# Patient Record
Sex: Male | Born: 1965 | Race: White | Hispanic: Yes | Marital: Married | Smoking: Never smoker
Health system: Southern US, Community
[De-identification: ages and names within clinical notes are randomized; demographics above are authoritative.]

## PROBLEM LIST (undated history)

## (undated) DIAGNOSIS — E119 Type 2 diabetes mellitus without complications: Secondary | ICD-10-CM

## (undated) DIAGNOSIS — I10 Essential (primary) hypertension: Secondary | ICD-10-CM

---

## 2006-01-31 ENCOUNTER — Ambulatory Visit: Payer: Self-pay | Admitting: *Deleted

## 2006-01-31 ENCOUNTER — Ambulatory Visit (HOSPITAL_COMMUNITY): Admission: RE | Admit: 2006-01-31 | Discharge: 2006-01-31 | Payer: Self-pay | Admitting: Family Medicine

## 2006-01-31 ENCOUNTER — Ambulatory Visit: Payer: Self-pay | Admitting: Family Medicine

## 2006-11-20 ENCOUNTER — Ambulatory Visit: Payer: Self-pay | Admitting: Internal Medicine

## 2007-01-17 ENCOUNTER — Encounter (INDEPENDENT_AMBULATORY_CARE_PROVIDER_SITE_OTHER): Payer: Self-pay | Admitting: *Deleted

## 2007-08-22 ENCOUNTER — Emergency Department (HOSPITAL_COMMUNITY): Admission: EM | Admit: 2007-08-22 | Discharge: 2007-08-22 | Payer: Self-pay | Admitting: Emergency Medicine

## 2007-09-03 ENCOUNTER — Ambulatory Visit: Payer: Self-pay | Admitting: Family Medicine

## 2007-09-16 IMAGING — CR DG KNEE 1-2V BILAT
4 series · 4 of 4 positions shown · non-contrast
Comparison: none

CLINICAL DATA: Left knee pain.  
 LEFT KNEE ? 2 VIEW:

[view not recorded (1 of 4)]
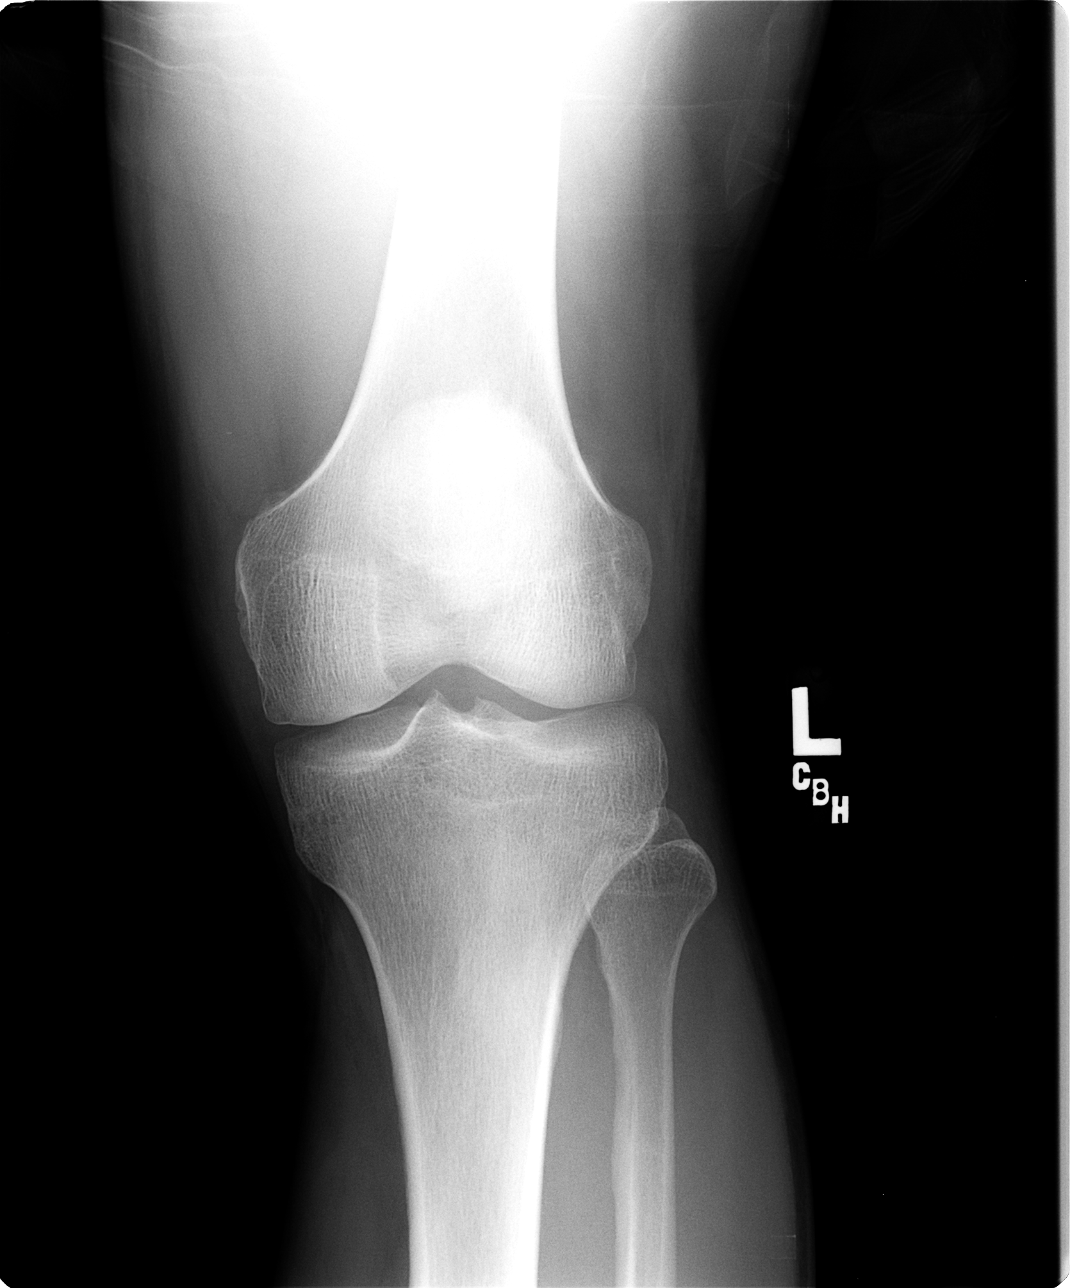

[view not recorded (2 of 4)]
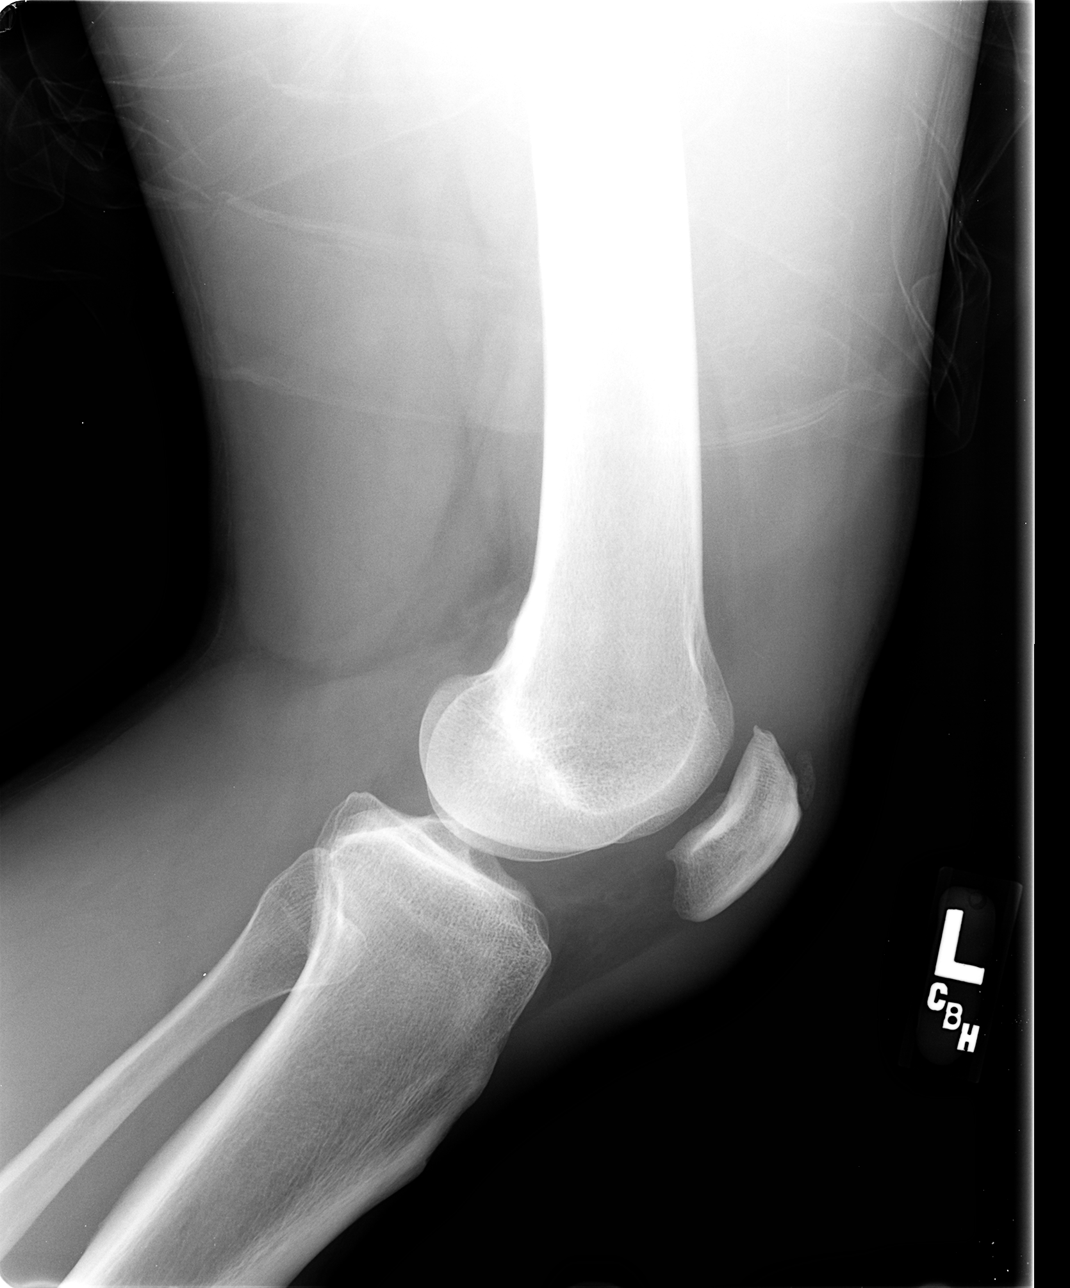

[view not recorded (3 of 4)]
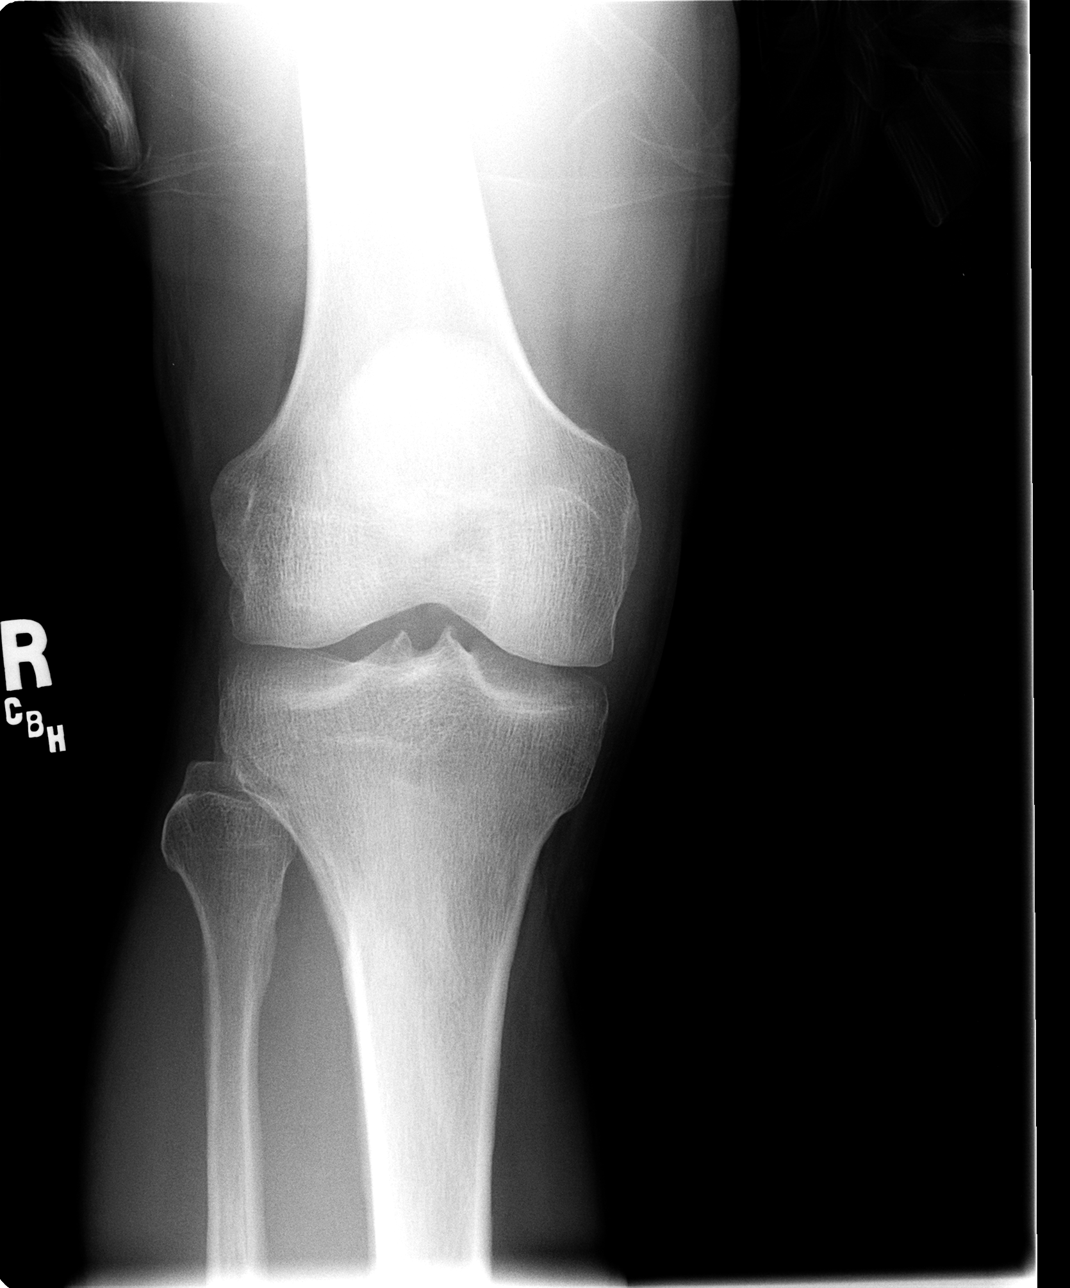

[view not recorded (4 of 4)]
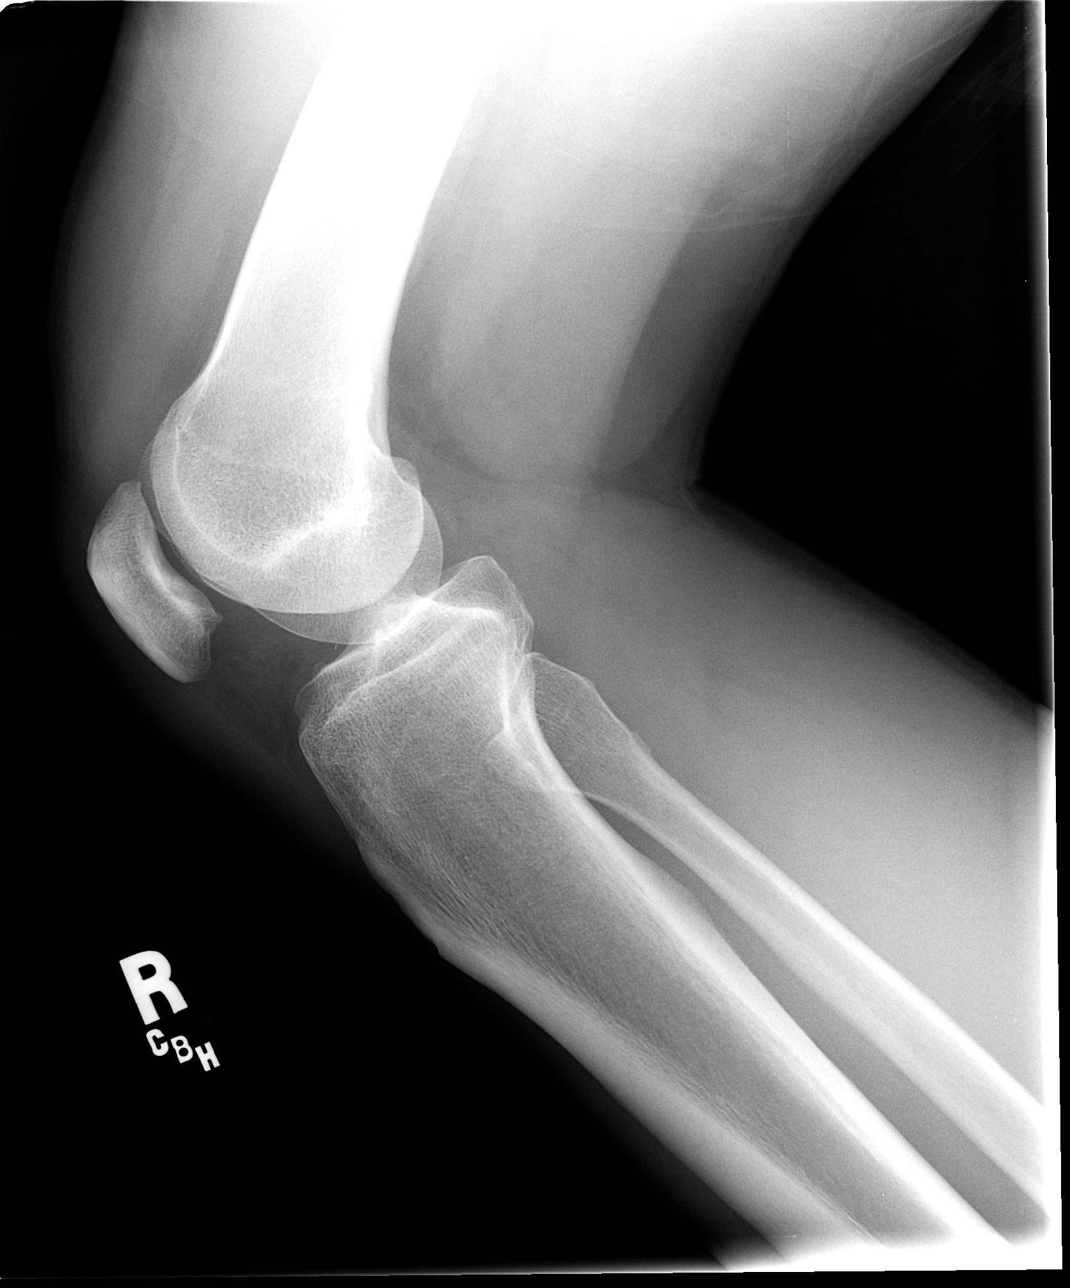

[4 of 4 positions shown; findings below may reference images not displayed]

FINDINGS: No fracture, dislocation, or significant degenerative changes.  Very mild spurring is noted involving the superior patella.  No joint fluid is seen.
IMPRESSION: Superior patellar spurring. 
 RIGHT KNEE ? 2 VIEW:
FINDINGS: Normal alignment.  No significant degenerative changes or fracture.  No joint effusion.
IMPRESSION: Normal right knee.

## 2009-03-24 ENCOUNTER — Ambulatory Visit: Payer: Self-pay | Admitting: Family Medicine

## 2009-03-24 LAB — CONVERTED CEMR LAB
Albumin: 4.8 g/dL (ref 3.5–5.2)
BUN: 12 mg/dL (ref 6–23)
Calcium: 9.6 mg/dL (ref 8.4–10.5)
Chloride: 101 meq/L (ref 96–112)
Eosinophils Relative: 2 % (ref 0–5)
Glucose, Bld: 128 mg/dL — ABNORMAL HIGH (ref 70–99)
HCT: 46.8 % (ref 39.0–52.0)
HDL: 44 mg/dL (ref >39)
Hemoglobin: 16.4 g/dL (ref 13.0–17.0)
Lymphocytes Relative: 27 % (ref 12–46)
Lymphs Abs: 1.5 10*3/uL (ref 0.7–4.0)
Monocytes Absolute: 0.5 10*3/uL (ref 0.1–1.0)
Monocytes Relative: 8 % (ref 3–12)
Potassium: 4.3 meq/L (ref 3.5–5.3)
Triglycerides: 379 mg/dL — ABNORMAL HIGH (ref <150)
WBC: 5.8 10*3/uL (ref 4.0–10.5)

## 2009-04-06 IMAGING — CR DG CHEST 2V
2 series · 2 of 2 positions shown · non-contrast
Comparison: None

CLINICAL DATA: Stomach pain

CHEST - 2 VIEW

[w chest pa]
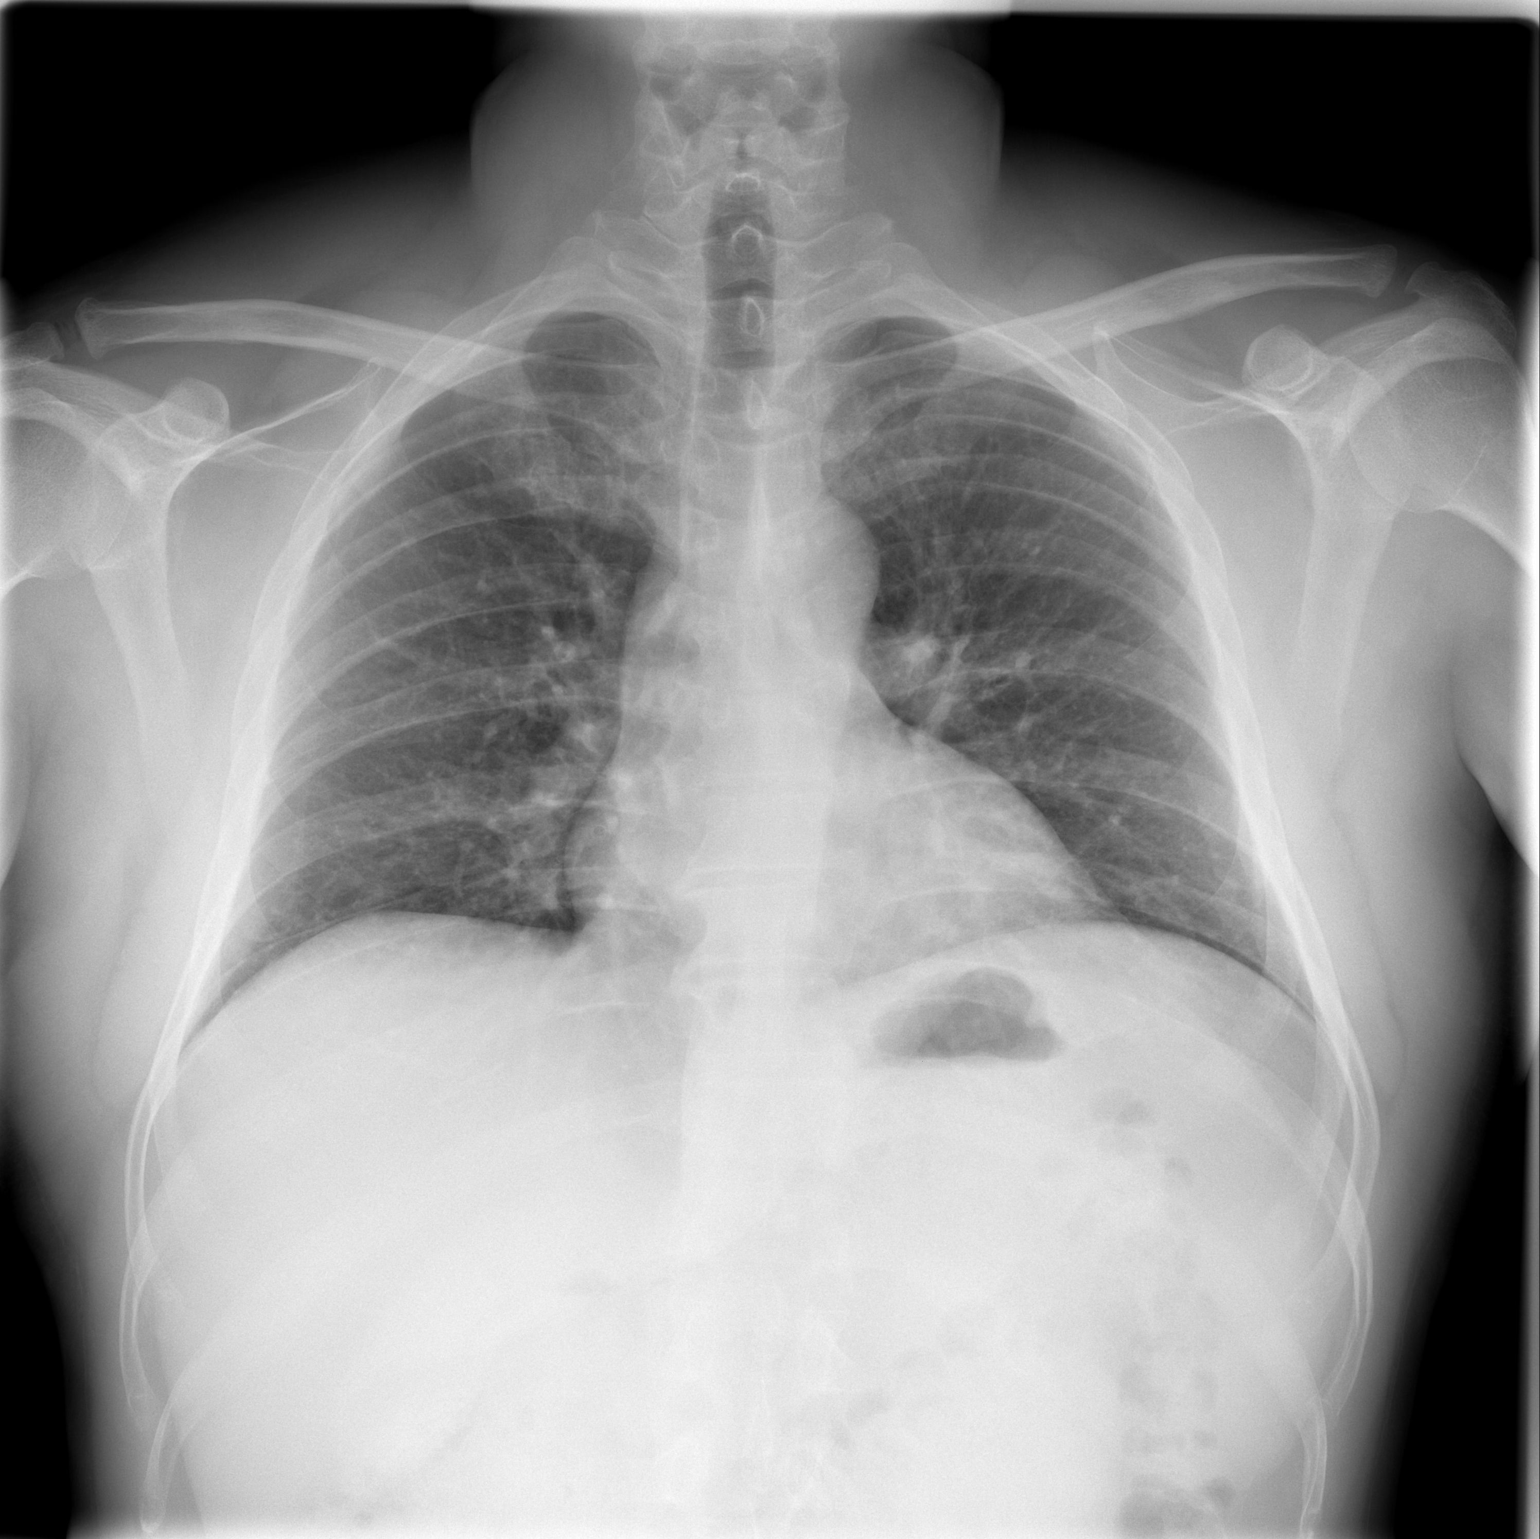

[w chest lat]
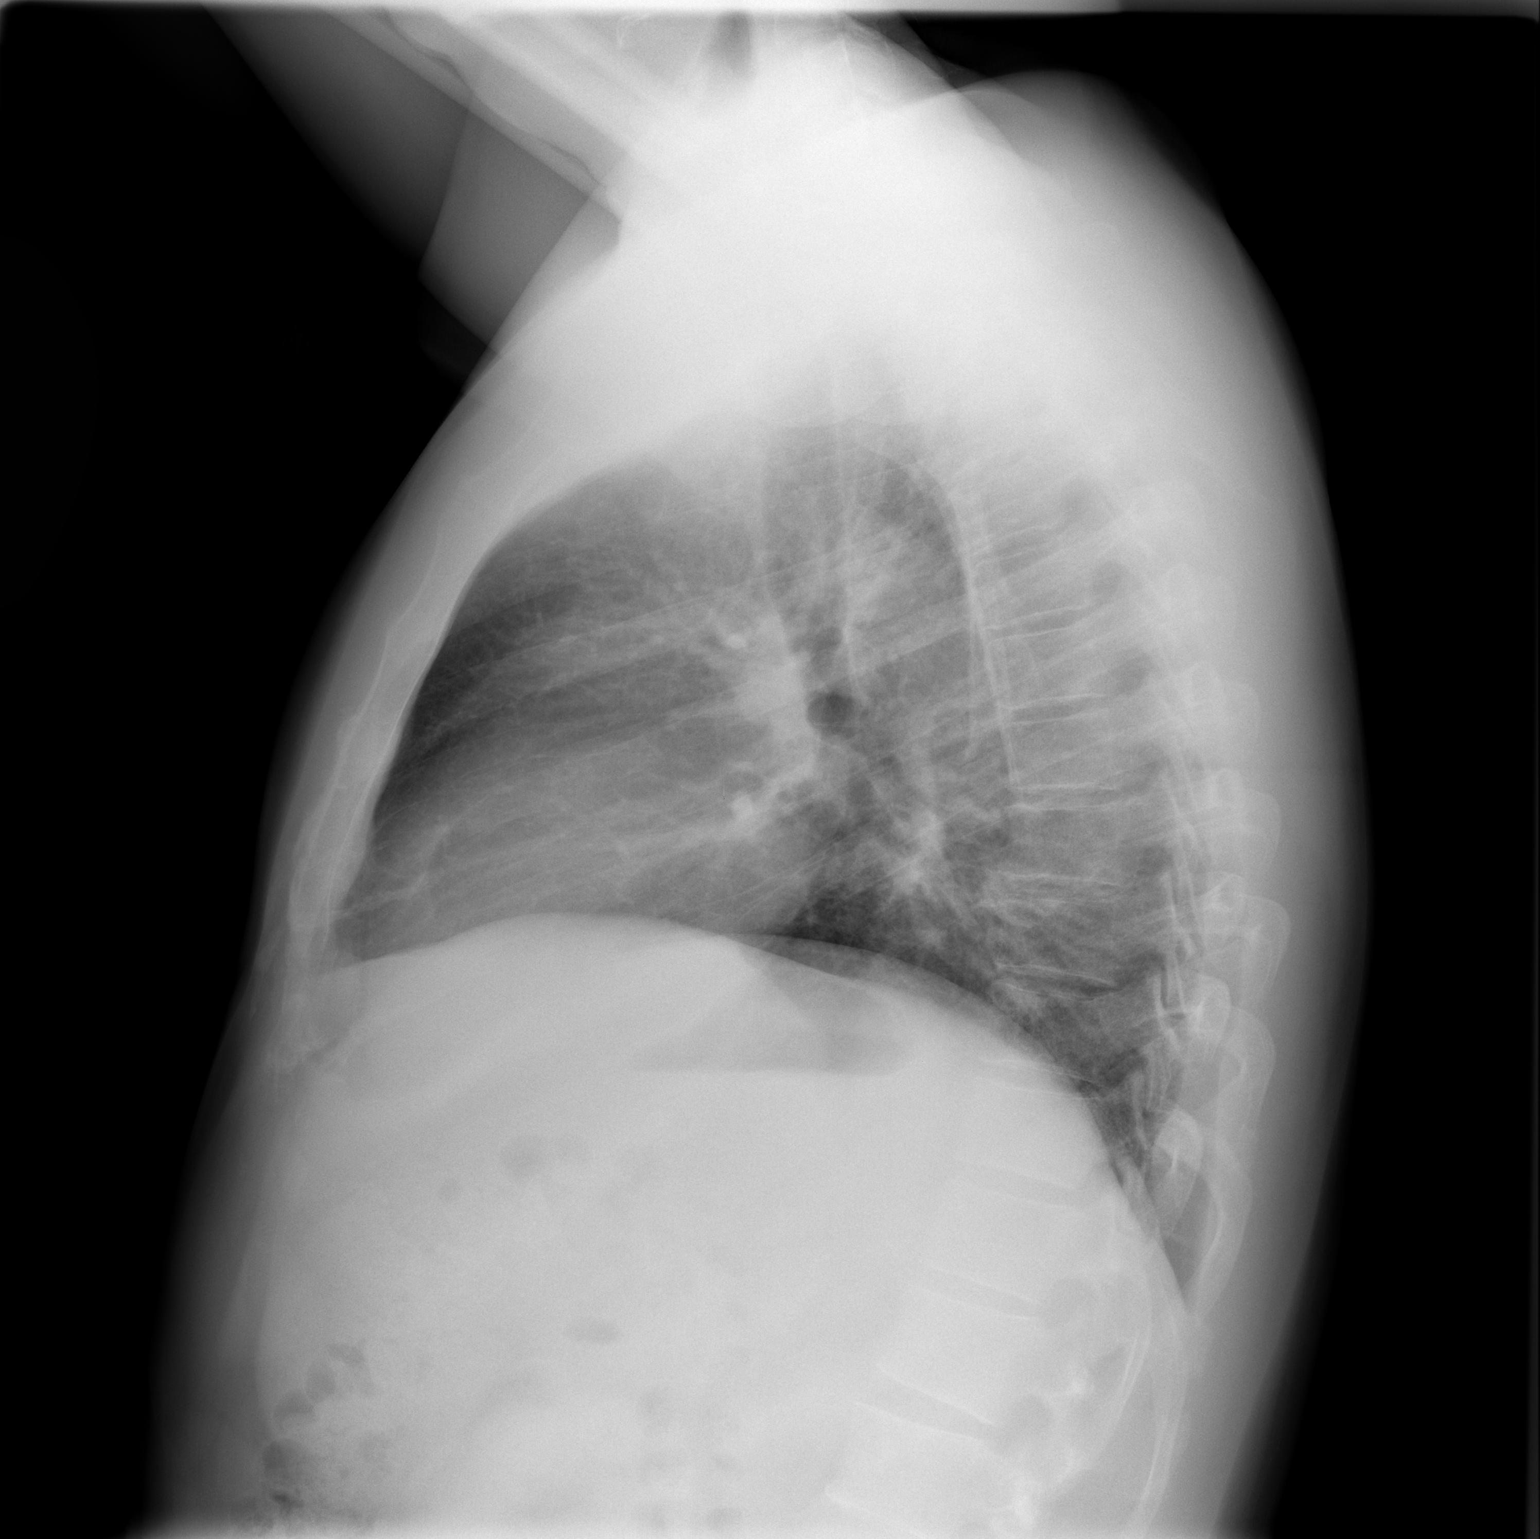

[2 of 2 positions shown; findings below may reference images not displayed]

FINDINGS: Heart size normal.

There is no pleural effusion or pulmonary edema.

The lung volumes are low.

No airspace opacities.
IMPRESSION: 1.  No active disease.

## 2009-04-30 ENCOUNTER — Ambulatory Visit: Payer: Self-pay | Admitting: Internal Medicine

## 2009-08-13 ENCOUNTER — Ambulatory Visit: Payer: Self-pay | Admitting: Family Medicine

## 2009-08-20 ENCOUNTER — Ambulatory Visit: Payer: Self-pay | Admitting: Family Medicine

## 2010-07-19 ENCOUNTER — Encounter (INDEPENDENT_AMBULATORY_CARE_PROVIDER_SITE_OTHER): Payer: Self-pay | Admitting: Family Medicine

## 2010-07-19 LAB — CONVERTED CEMR LAB
ALT: 19 units/L (ref 0–53)
AST: 19 units/L (ref 0–37)
CO2: 26 meq/L (ref 19–32)
Chloride: 102 meq/L (ref 96–112)
Cholesterol: 184 mg/dL (ref 0–200)
Sodium: 139 meq/L (ref 135–145)
Testosterone Free: 85.2 pg/mL (ref 47.0–244.0)
Testosterone-% Free: 2.3 % (ref 1.6–2.9)
Testosterone: 370.82 ng/dL (ref 250–890)
Total Bilirubin: 0.7 mg/dL (ref 0.3–1.2)
Total Protein: 7.5 g/dL (ref 6.0–8.3)
VLDL: 60 mg/dL — ABNORMAL HIGH (ref 0–40)

## 2018-10-22 ENCOUNTER — Ambulatory Visit
Admission: EM | Admit: 2018-10-22 | Discharge: 2018-10-22 | Disposition: A | Discharge status: Home / Self Care | Payer: Self-pay

## 2018-10-22 ENCOUNTER — Other Ambulatory Visit: Payer: Self-pay

## 2018-10-22 ENCOUNTER — Encounter: Payer: Self-pay | Admitting: Physician Assistant

## 2018-10-22 DIAGNOSIS — H5711 Ocular pain, right eye: Secondary | ICD-10-CM

## 2018-10-22 DIAGNOSIS — H109 Unspecified conjunctivitis: Secondary | ICD-10-CM

## 2018-10-22 HISTORY — DX: Essential (primary) hypertension: I10

## 2018-10-22 HISTORY — DX: Type 2 diabetes mellitus without complications: E11.9

## 2018-10-22 MED ORDER — OFLOXACIN 0.3 % OP SOLN: 1.0000 [drp] | Freq: Four times a day (QID) | OPHTHALMIC | 0 refills | Status: AC

## 2018-10-22 NOTE — Discharge Instructions (Signed)
Use ofloxacin eyedrops as directed on right eye. Artificial tear gel at night. Wait 10-15 minutes between drops, always use artificial tear gel last, as it prevents drops from penetrating through. Lid scrubs and warm compresses as directed. Monitor for any worsening of symptoms, changes in vision, sensitivity to light, eye swelling, painful eye movement, follow up with ophthalmology for further evaluation.

## 2018-10-22 NOTE — ED Provider Notes (Signed)
[facility]    CSN: [id number] Arrival date & time: 10/22/18  1654      History   Chief Complaint Chief Complaint  Patient presents with  . Eye Pain    HPI Steve Mccarthy is a 53 y.o. male.   53 year old male comes in for evaluation of possible left eye foreign body. States he was cutting wood yesterday and had had right eye pain, redness, watering and photophobia. Denies vision changes. He states was wearing regular glasses, but did not have other protective eye wear. He flushed his eye with water and still have foreign body sensation and came in for evaluation.      Past Medical History:  Diagnosis Date  . Diabetes mellitus without complication ([place])   . Hypertension     There are no active problems to display for this patient.   History reviewed. No pertinent surgical history.     Home Medications    Prior to Admission medications   Medication Sig Start Date End Date Taking? Authorizing Provider  DULoxetine (CYMBALTA) 20 MG capsule Take 20 mg by mouth daily.   Yes [provider]  insulin aspart (NOVOLOG) 100 UNIT/ML injection Inject 16 Units into the skin once.   Yes [provider]  lisinopril (ZESTRIL) 10 MG tablet Take 10 mg by mouth daily.   Yes [provider]  ofloxacin (OCUFLOX) 0.3 % ophthalmic solution Place 1 drop into the right eye 4 (four) times daily for 7 days. 10/22/18 10/29/18  Ok Edwards, PA-C    Family History No family history on file.  Social History Social History   Tobacco Use  . Smoking status: Never Smoker  . Smokeless tobacco: Never Used  Substance Use Topics  . Alcohol use: Not Currently  . Drug use: Not on file     Allergies   Patient has no known allergies.   Review of Systems Review of Systems  Reason unable to perform ROS: See HPI as above.     Physical Exam Triage Vital Signs ED Triage Vitals [10/22/18 1717]  Enc Vitals Group     BP 120/77     Pulse Rate  77     Resp 18     Temp 98.4 F (36.9 C)     Temp Source Oral     SpO2 97 %     Weight      Height      Head Circumference      Peak Flow      Pain Score 0     Pain Loc      Pain Edu?      Excl. in [place]?    No data found.  Updated Vital Signs BP 120/77 (BP Location: Left Arm)   Pulse 77   Temp 98.4 F (36.9 C) (Oral)   Resp 18   SpO2 97%   Visual Acuity Right Eye Distance: 20/30 Left Eye Distance: 20/40 Bilateral Distance: 20/20  Right Eye Near:   Left Eye Near:    Bilateral Near:     Physical Exam Constitutional:      General: He is not in acute distress.    Appearance: He is well-developed. He is not ill-appearing, toxic-appearing or diaphoretic.  HENT:     Head: Normocephalic and atraumatic.  Eyes:     General: Lids are normal. Lids are everted, no foreign bodies appreciated.     Extraocular Movements: Extraocular movements intact.     Conjunctiva/sclera:  Right eye: Right conjunctiva is injected.     Left eye: Left conjunctiva is not injected.     Pupils: Pupils are equal, round, and reactive to light.     Comments: No obvious foreign body seen. Fluorescein stain without uptake.  Neck:     Musculoskeletal: Normal range of motion and neck supple.  Skin:    General: Skin is warm and dry.  Neurological:     Mental Status: He is alert and oriented to person, place, and time.      UC Treatments / Results  Labs (all labs ordered are listed, but only abnormal results are displayed) Labs Reviewed - No data to display  EKG None  Radiology No results found.  Procedures Procedures (including critical care time)  Medications Ordered in UC Medications - No data to display  Initial Impression / Assessment and Plan / UC Course  I have reviewed the triage vital signs and the nursing notes.  Pertinent labs & imaging results that were available during my care of the patient were reviewed by me and considered in my medical decision making (see chart for  details).    No foreign body seen. Fluorescein stain without uptake. Patient with great improvement of symptoms after tetracaine, with resolution of pain and photophobia. Will cover for conjunctivitis with ofloxacin, but also ?irrigation from tap water flushing eye. Other symptomatic treatment discussed. Return precautions given. Patient expresses understanding and agrees to plan.  Final Clinical Impressions(s) / UC Diagnoses   Final diagnoses:  Acute right eye pain   ED Prescriptions    Medication Sig Dispense Auth. Provider   ofloxacin (OCUFLOX) 0.3 % ophthalmic solution Place 1 drop into the right eye 4 (four) times daily for 7 days. 1.4 mL Threasa AlphaYu, Falcon Mccaskey V, PA-C        Ison Wichmann V, [place] 10/22/18 1750

## 2018-10-22 NOTE — ED Triage Notes (Signed)
Pt c/o rt eye pain, redness, watery, and sensitive to light since after cutting wood yesterday.

## 2022-08-07 ENCOUNTER — Ambulatory Visit
Admission: EM | Admit: 2022-08-07 | Discharge: 2022-08-07 | Disposition: A | Discharge status: Home / Self Care | Payer: Self-pay | Attending: Internal Medicine | Admitting: Internal Medicine

## 2022-08-07 ENCOUNTER — Other Ambulatory Visit: Payer: Self-pay

## 2022-08-07 ENCOUNTER — Encounter: Payer: Self-pay | Admitting: Emergency Medicine

## 2022-08-07 DIAGNOSIS — S0501XA Injury of conjunctiva and corneal abrasion without foreign body, right eye, initial encounter: Secondary | ICD-10-CM

## 2022-08-07 MED ORDER — ERYTHROMYCIN 5 MG/GM OP OINT: TOPICAL_OINTMENT | OPHTHALMIC | 0 refills | Status: AC

## 2022-08-07 NOTE — Discharge Instructions (Addendum)
You have a corneal abrasion which is a scratch of your eye.  I have prescribed an antibiotic ointment to help soothe and treat/prevent infection related to this.  Follow-up with eye doctor tomorrow for further evaluation and management.

## 2022-08-07 NOTE — ED Triage Notes (Signed)
Pt here for right pain after something got into his eye last night

## 2022-08-07 NOTE — ED Provider Notes (Signed)
[facility]    CSN: [id number] Arrival date & time: 08/07/22  1144      History   Chief Complaint Chief Complaint  Patient presents with   Eye Pain    HPI Steve Mccarthy is a 57 y.o. male.   Patient presents with right eye pain and irritation that started last night.  Patient reports that he was playing outside with his granddaughter and thinks that something may have flew into his eye but he is not sure exactly what it was.  Denies blurry vision.  Reports that he washed his eye out.  Does not wear contacts or glasses.   Eye Pain    Past Medical History:  Diagnosis Date   Diabetes mellitus without complication    Hypertension     There are no problems to display for this patient.   History reviewed. No pertinent surgical history.     Home Medications    Prior to Admission medications   Medication Sig Start Date End Date Taking? Authorizing Provider  erythromycin ophthalmic ointment Place a 1/2 inch ribbon of ointment into the lower eyelid 4 times daily for 7 days. 08/07/22  Yes Gared Gillie, Rolly Salter E, FNP  DULoxetine (CYMBALTA) 20 MG capsule Take 20 mg by mouth daily.    [provider]  insulin aspart (NOVOLOG) 100 UNIT/ML injection Inject 16 Units into the skin once.    [provider]  lisinopril (ZESTRIL) 10 MG tablet Take 10 mg by mouth daily.    [provider]    Family History History reviewed. No pertinent family history.  Social History Social History   Tobacco Use   Smoking status: Never   Smokeless tobacco: Never  Substance Use Topics   Alcohol use: Not Currently     Allergies   Patient has no known allergies.   Review of Systems Review of Systems Per HPI  Physical Exam Triage Vital Signs ED Triage Vitals [08/07/22 1333]  Enc Vitals Group     BP (!) 138/90     Pulse Rate 90     Resp 18     Temp 98.1 F (36.7 C)     Temp Source Oral     SpO2 96 %     Weight      Height      Head  Circumference      Peak Flow      Pain Score 3     Pain Loc      Pain Edu?      Excl. in [place]?    No data found.  Updated Vital Signs BP (!) 138/90 (BP Location: Left Arm)   Pulse 90   Temp 98.1 F (36.7 C) (Oral)   Resp 18   SpO2 96%   Visual Acuity Right Eye Distance:   Left Eye Distance:   Bilateral Distance:    Right Eye Near: R Near: 20/20 Left Eye Near:  L Near: 20/20 Bilateral Near:  20/20  Physical Exam Constitutional:      General: He is not in acute distress.    Appearance: Normal appearance. He is not toxic-appearing or diaphoretic.  HENT:     Head: Normocephalic and atraumatic.  Eyes:     General: Lids are normal. Lids are everted, no foreign bodies appreciated. Vision grossly intact. Gaze aligned appropriately.     Extraocular Movements: Extraocular movements intact.     Conjunctiva/sclera: Conjunctivae normal.     Pupils: Pupils are equal, round, and reactive to light.  Right eye: Corneal abrasion and fluorescein uptake present.     Comments: Patient has approximately 2 millimeter corneal abrasion present to right upper eye with another 1 mm corneal abrasion directly to the left of that abrasion. No foreign bodies noted.   Pulmonary:     Effort: Pulmonary effort is normal.  Neurological:     General: No focal deficit present.     Mental Status: He is alert and oriented to person, place, and time. Mental status is at baseline.  Psychiatric:        Mood and Affect: Mood normal.        Behavior: Behavior normal.        Thought Content: Thought content normal.        Judgment: Judgment normal.      UC Treatments / Results  Labs (all labs ordered are listed, but only abnormal results are displayed) Labs Reviewed - No data to display  EKG   Radiology No results found.  Procedures Procedures (including critical care time)  Medications Ordered in UC Medications - No data to display  Initial Impression / Assessment and Plan / UC Course  I  have reviewed the triage vital signs and the nursing notes.  Pertinent labs & imaging results that were available during my care of the patient were reviewed by me and considered in my medical decision making (see chart for details).     Patient has 2 corneal abrasions present to right eye.  No obvious foreign bodies noted.  No obvious ulcer noted.  Visual acuity is normal so do not think that emergent evaluation is necessary.  Will treat with erythromycin.  Patient advised to follow-up with ophthalmology tomorrow given that there are 2 abrasions that are quite large.  He verbalized understanding and was agreeable with plan. Final Clinical Impressions(s) / UC Diagnoses   Final diagnoses:  Cornea abrasion, right, initial encounter     Discharge Instructions      You have a corneal abrasion which is a scratch of your eye.  I have prescribed an antibiotic ointment to help soothe and treat/prevent infection related to this.  Follow-up with eye doctor tomorrow for further evaluation and management.    ED Prescriptions     Medication Sig Dispense Auth. Provider   erythromycin ophthalmic ointment Place a 1/2 inch ribbon of ointment into the lower eyelid 4 times daily for 7 days. 3.5 g Gustavus Bryant, [place]      PDMP not reviewed this encounter.   Gustavus Bryant, [place] 08/07/22 1414

## 2023-05-30 ENCOUNTER — Encounter (HOSPITAL_COMMUNITY): Payer: Self-pay

## 2023-05-30 ENCOUNTER — Emergency Department (HOSPITAL_COMMUNITY)
Admission: EM | Admit: 2023-05-30 | Discharge: 2023-05-31 | Disposition: A | Discharge status: Home / Self Care | Payer: Self-pay | Attending: Emergency Medicine | Admitting: Emergency Medicine

## 2023-05-30 DIAGNOSIS — N200 Calculus of kidney: Secondary | ICD-10-CM | POA: Insufficient documentation

## 2023-05-30 DIAGNOSIS — R7309 Other abnormal glucose: Secondary | ICD-10-CM | POA: Insufficient documentation

## 2023-05-30 LAB — COMPREHENSIVE METABOLIC PANEL
ALT: 28 U/L (ref 0–44)
AST: 28 U/L (ref 15–41)
Albumin: 4 g/dL (ref 3.5–5.0)
Alkaline Phosphatase: 51 U/L (ref 38–126)
Anion gap: 9 (ref 5–15)
BUN: 10 mg/dL (ref 6–20)
CO2: 25 mmol/L (ref 22–32)
Calcium: 9.1 mg/dL (ref 8.9–10.3)
Chloride: 104 mmol/L (ref 98–111)
Creatinine, Ser: 1.29 mg/dL — ABNORMAL HIGH (ref 0.61–1.24)
GFR, Estimated: >60 mL/min (ref >60)
Glucose, Bld: 149 mg/dL — ABNORMAL HIGH (ref 70–99)
Potassium: 4.2 mmol/L (ref 3.5–5.1)
Sodium: 138 mmol/L (ref 135–145)
Total Bilirubin: 0.8 mg/dL (ref 0.0–1.2)
Total Protein: 7.5 g/dL (ref 6.5–8.1)

## 2023-05-30 LAB — CBC
HCT: 38 % — ABNORMAL LOW (ref 39.0–52.0)
Hemoglobin: 13 g/dL (ref 13.0–17.0)
MCH: 28.9 pg (ref 26.0–34.0)
MCHC: 34.2 g/dL (ref 30.0–36.0)
MCV: 84.4 fL (ref 80.0–100.0)
Platelets: 273 10*3/uL (ref 150–400)
RBC: 4.5 MIL/uL (ref 4.22–5.81)
RDW: 12.5 % (ref 11.5–15.5)
WBC: 8.1 10*3/uL (ref 4.0–10.5)
nRBC: 0 % (ref 0.0–0.2)

## 2023-05-30 LAB — LIPASE, BLOOD: Lipase: 43 U/L (ref 11–51)

## 2023-05-30 MED ORDER — OXYCODONE-ACETAMINOPHEN 5-325 MG PO TABS
2.0000 | ORAL_TABLET | Freq: Once | ORAL | Status: AC
  Administered 2023-05-30: 2 via ORAL
  Filled 2023-05-30: qty 2

## 2023-05-30 NOTE — ED Provider Triage Note (Signed)
Emergency Medicine Provider Triage Evaluation Note  Steve Mccarthy , a 58 y.o. male  was evaluated in triage.  Pt complains of LLQ abdominal pain that started yesterday.  States he feels a bit distended.  Reports difficulty with BMs.  No prior abdominal surgeries.  Review of Systems  Positive: Abdominal pain Negative: fever  Physical Exam  BP (!) 187/102   Pulse 75   Temp 98.9 F (37.2 C)   Resp 18   SpO2 99%  Gen:   Awake, no distress   Resp:  Normal effort  MSK:   Moves extremities without difficulty  Other:  LLQ TTP  Medical Decision Making  Medically screening exam initiated at 11:10 PM.  Appropriate orders placed.  Aymen Rodriguez-Gutierrez was informed that the remainder of the evaluation will be completed by another provider, this initial triage assessment does not replace that evaluation, and the importance of remaining in the ED until their evaluation is complete.     Roxy Horseman, PA-C 05/30/23 2311

## 2023-05-30 NOTE — ED Triage Notes (Signed)
Pt is coming in for abd pain that started yesterday morning, he mentions the pain is localized in his left flank going around into his back and abd. He also feels like his abd is slightly distended. He has been having regular daily bowel movents and has no urinary symptoms. No Hx of of diverticulitis, and no Hx of bowel obstruction

## 2023-05-31 ENCOUNTER — Emergency Department (HOSPITAL_COMMUNITY): Payer: Self-pay

## 2023-05-31 ENCOUNTER — Other Ambulatory Visit: Payer: Self-pay

## 2023-05-31 LAB — URINALYSIS, ROUTINE W REFLEX MICROSCOPIC
Bilirubin Urine: NEGATIVE
Glucose, UA: NEGATIVE mg/dL
Hgb urine dipstick: NEGATIVE
Ketones, ur: NEGATIVE mg/dL
Leukocytes,Ua: NEGATIVE
Nitrite: NEGATIVE
Protein, ur: NEGATIVE mg/dL
Specific Gravity, Urine: 1.032 — ABNORMAL HIGH (ref 1.005–1.030)
pH: 6 (ref 5.0–8.0)

## 2023-05-31 LAB — CBG MONITORING, ED: Glucose-Capillary: 149 mg/dL — ABNORMAL HIGH (ref 70–99)

## 2023-05-31 MED ORDER — ONDANSETRON 4 MG PO TBDP: 4.0000 mg | ORAL_TABLET | Freq: Three times a day (TID) | ORAL | 0 refills | Status: AC | PRN

## 2023-05-31 MED ORDER — IBUPROFEN 400 MG PO TABS: 400.0000 mg | ORAL_TABLET | Freq: Three times a day (TID) | ORAL | 0 refills | Status: AC

## 2023-05-31 MED ORDER — KETOROLAC TROMETHAMINE 30 MG/ML IJ SOLN
30.0000 mg | Freq: Once | INTRAMUSCULAR | Status: AC
  Administered 2023-05-31: 30 mg via INTRAVENOUS
  Filled 2023-05-31: qty 1

## 2023-05-31 MED ORDER — IOHEXOL 350 MG/ML SOLN
75.0000 mL | Freq: Once | INTRAVENOUS | Status: AC | PRN
  Administered 2023-05-31: 75 mL via INTRAVENOUS

## 2023-05-31 MED ORDER — HYDROCODONE-ACETAMINOPHEN 5-325 MG PO TABS: 1.0000 | ORAL_TABLET | Freq: Four times a day (QID) | ORAL | 0 refills | Status: AC | PRN

## 2023-05-31 NOTE — Discharge Instructions (Addendum)
Please be sure to follow-up with our urology colleagues.  Your kidney stone is 6 mm, and should pass on its own with good hydration, and taking your medications as prescribed.  However, if you develop new, or concerning changes, such as fever, persistent nausea and vomiting, return to the emergency department.

## 2023-05-31 NOTE — ED Notes (Signed)
This RN reviewed discharge instructions with patient. He verbalized understanding and denied any further questions. PT well appearing upon discharge and reports tolerable pain. Pt ambulated with stable gait to exit. Pt endorses ride home with family.

## 2023-05-31 NOTE — ED Provider Notes (Signed)
[place] EMERGENCY DEPARTMENT AT [facility] Provider Note   CSN: [id number] Arrival date & time: 05/30/23  2142     History  Chief Complaint  Patient presents with   Abdominal Pain    Steve Mccarthy is a 58 y.o. male.  HPI Patient presents with family member assist with the history.  He presents with acute onset flank pain yesterday.  Since that time pain has been persistent.  By the time my evaluation the patient's results are available and he is obtained Toradol, analgesia as well as Zofran for nausea. Patient is generally well has no history of renal issues, was well until today.    Home Medications Prior to Admission medications   Medication Sig Start Date End Date Taking? Authorizing Provider  HYDROcodone-acetaminophen (NORCO) 5-325 MG tablet Take 1 tablet by mouth every 6 (six) hours as needed for moderate pain (pain score 4-6). 05/31/23  Yes Gerhard Munch, MD  ibuprofen (ADVIL) 400 MG tablet Take 1 tablet (400 mg total) by mouth 3 (three) times daily for 3 days. Take one tablet three times daily for three days 05/31/23 06/03/23 Yes Gerhard Munch, MD  ondansetron (ZOFRAN-ODT) 4 MG disintegrating tablet Take 1 tablet (4 mg total) by mouth every 8 (eight) hours as needed for nausea or vomiting. 05/31/23  Yes Gerhard Munch, MD  DULoxetine (CYMBALTA) 20 MG capsule Take 20 mg by mouth daily.    [provider]  erythromycin ophthalmic ointment Place a 1/2 inch ribbon of ointment into the lower eyelid 4 times daily for 7 days. 08/07/22   Gustavus Bryant, FNP  insulin aspart (NOVOLOG) 100 UNIT/ML injection Inject 16 Units into the skin once.    [provider]  lisinopril (ZESTRIL) 10 MG tablet Take 10 mg by mouth daily.    [provider]      Allergies    Patient has no known allergies.    Review of Systems   Review of Systems  Physical Exam Updated Vital Signs BP (!) 157/85 (BP Location: Left Arm)   Pulse 70   Temp  98.2 F (36.8 C)   Resp 18   SpO2 100%  Physical Exam Vitals and nursing note reviewed.  Constitutional:      General: He is not in acute distress.    Appearance: He is well-developed.  HENT:     Head: Normocephalic and atraumatic.  Eyes:     Conjunctiva/sclera: Conjunctivae normal.  Cardiovascular:     Rate and Rhythm: Normal rate and regular rhythm.  Pulmonary:     Effort: Pulmonary effort is normal. No respiratory distress.     Breath sounds: No stridor.  Abdominal:     General: There is no distension.  Skin:    General: Skin is warm and dry.  Neurological:     Mental Status: He is alert and oriented to person, place, and time.     ED Results / Procedures / Treatments   Labs (all labs ordered are listed, but only abnormal results are displayed) Labs Reviewed  COMPREHENSIVE METABOLIC PANEL - Abnormal; Notable for the following components:      Result Value   Glucose, Bld 149 (*)    Creatinine, Ser 1.29 (*)    All other components within normal limits  CBC - Abnormal; Notable for the following components:   HCT 38.0 (*)    All other components within normal limits  URINALYSIS, ROUTINE W REFLEX MICROSCOPIC - Abnormal; Notable for the following components:   Specific Gravity,  Urine 1.032 (*)    All other components within normal limits  CBG MONITORING, ED - Abnormal; Notable for the following components:   Glucose-Capillary 149 (*)    All other components within normal limits  LIPASE, BLOOD    EKG None  Radiology CT ABDOMEN PELVIS W CONTRAST Result Date: 05/31/2023 CLINICAL DATA:  Left lower quadrant pain EXAM: CT ABDOMEN AND PELVIS WITH CONTRAST TECHNIQUE: Multidetector CT imaging of the abdomen and pelvis was performed using the standard protocol following bolus administration of intravenous contrast. RADIATION DOSE REDUCTION: This exam was performed according to the departmental dose-optimization program which includes automated exposure control, adjustment of  the mA and/or kV according to patient size and/or use of iterative reconstruction technique. CONTRAST:  75mL OMNIPAQUE IOHEXOL 350 MG/ML SOLN COMPARISON:  None Available. FINDINGS: Lower chest: Are minimal patchy ground-glass opacities in the lung bases. Hepatobiliary: No focal liver abnormality is seen. No gallstones, gallbladder wall thickening, or biliary dilatation. Pancreas: Unremarkable. No pancreatic ductal dilatation or surrounding inflammatory changes. Spleen: Normal in size without focal abnormality. Adrenals/Urinary Tract: There is a 6 x 3 x 2 mm calculus in the mid right ureter. There is mild right-sided hydronephrosis and perinephric fat stranding. Right kidney, bladder and adrenal glands are within normal limits. Stomach/Bowel: Stomach is within normal limits. Appendix appears normal. No evidence of bowel wall thickening, distention, or inflammatory changes. There some wall thickening of the distal esophagus. Vascular/Lymphatic: No significant vascular findings are present. No enlarged abdominal or pelvic lymph nodes. Reproductive: Uterus and bilateral adnexa are unremarkable. Other: There small fat containing inguinal hernias.  No ascites. Musculoskeletal: No acute or significant osseous findings. IMPRESSION: 1. 6 mm calculus in the mid left ureter with mild obstructive uropathy. 2. Minimal patchy ground-glass opacities in the lung bases, nonspecific. Findings may be infectious/inflammatory. 3. Wall thickening of the distal esophagus may represent esophagitis. Electronically Signed   By: Darliss Cheney M.D.   On: 05/31/2023 01:48    Procedures Procedures    Medications Ordered in ED Medications  oxyCODONE-acetaminophen (PERCOCET/ROXICET) 5-325 MG per tablet 2 tablet (2 tablets Oral Given 05/30/23 2319)  iohexol (OMNIPAQUE) 350 MG/ML injection 75 mL (75 mLs Intravenous Contrast Given 05/31/23 0138)  ketorolac (TORADOL) 30 MG/ML injection 30 mg (30 mg Intravenous Given 05/31/23 1610)    ED  Course/ Medical Decision Making/ A&P                                 Medical Decision Making Adult male presents with flank pain initial concern for renal dysfunction versus GI processes given his age, description of symptoms. Patient CT reviewed, notable for 6 mm stone, right intraureteral.  No evidence for complete obstruction, bacteremia, sepsis.  Patient had substantial pain control here, was discussed with family members, was discharged in stable condition.  Amount and/or Complexity of Data Reviewed Independent Historian:     Details: Family Labs:  Decision-making details documented in ED Course. Radiology: independent interpretation performed. Decision-making details documented in ED Course.  Risk Prescription drug management. Decision regarding hospitalization. Diagnosis or treatment significantly limited by social determinants of health.   Final Clinical Impression(s) / ED Diagnoses Final diagnoses:  Nephrolithiasis    Rx / DC Orders ED Discharge Orders          Ordered    ibuprofen (ADVIL) 400 MG tablet  3 times daily        05/31/23 0851    HYDROcodone-acetaminophen (NORCO)  5-325 MG tablet  Every 6 hours PRN        05/31/23 0851    ondansetron (ZOFRAN-ODT) 4 MG disintegrating tablet  Every 8 hours PRN        05/31/23 0851              Gerhard Munch, MD 05/31/23 [phone]

## 2023-12-18 ENCOUNTER — Ambulatory Visit: Payer: Self-pay | Attending: Family Medicine | Admitting: Rehabilitative and Restorative Service Providers"

## 2023-12-18 DIAGNOSIS — M6281 Muscle weakness (generalized): Secondary | ICD-10-CM | POA: Insufficient documentation

## 2023-12-18 DIAGNOSIS — R293 Abnormal posture: Secondary | ICD-10-CM | POA: Insufficient documentation

## 2023-12-18 DIAGNOSIS — M25512 Pain in left shoulder: Secondary | ICD-10-CM | POA: Insufficient documentation

## 2023-12-18 DIAGNOSIS — G8929 Other chronic pain: Secondary | ICD-10-CM | POA: Insufficient documentation

## 2023-12-18 DIAGNOSIS — M25511 Pain in right shoulder: Secondary | ICD-10-CM | POA: Insufficient documentation

## 2023-12-18 DIAGNOSIS — M25522 Pain in left elbow: Secondary | ICD-10-CM | POA: Insufficient documentation

## 2023-12-26 NOTE — Therapy (Incomplete)
 OUTPATIENT PHYSICAL THERAPY LOWER EXTREMITY EVALUATION   Patient Name: Steve Mccarthy MRN: [id number] DOB:02/05/1966, 58 y.o., male Today's Date: 12/26/2023  END OF SESSION:   Past Medical History:  Diagnosis Date   Diabetes mellitus without complication (HCC)    Hypertension    No past surgical history on file. There are no active problems to display for this patient.   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  Rationale for Evaluation and Treatment: Rehabilitation  ONSET DATE: ***  SUBJECTIVE:   SUBJECTIVE STATEMENT: ***  PERTINENT HISTORY: HTN, DBM PAIN:  Are you having pain? Yes: NPRS scale: *** Pain location: *** Pain description: *** Aggravating factors: *** Relieving factors: ***  PRECAUTIONS: {Therapy precautions:24002}  RED FLAGS: {PT Red Flags:29287}   WEIGHT BEARING RESTRICTIONS: {Yes ***/No:24003}  FALLS:  Has patient fallen in last 6 months? {fallsyesno:27318}  LIVING ENVIRONMENT: Lives with: {OPRC lives with:25569::lives with their family} Lives in: {Lives in:25570} Stairs: {opstairs:27293} Has following equipment at home: {Assistive devices:23999}  OCCUPATION: ***  PLOF: {PLOF:24004}  PATIENT GOALS: ***  NEXT MD VISIT: ***  OBJECTIVE:  Note: Objective measures were completed at Evaluation unless otherwise noted.  DIAGNOSTIC FINDINGS: ***  PATIENT SURVEYS:  LEFS  COGNITION: Overall cognitive status: {cognition:24006}     SENSATION: {sensation:27233}  EDEMA:  {edema:24020}  MUSCLE LENGTH: Hamstrings: Right *** deg; Left *** deg Debby test: Right *** deg; Left *** deg  POSTURE: {posture:25561}  PALPATION: ***  LOWER EXTREMITY ROM:  {AROM/PROM:27142} ROM Right eval Left eval  Hip flexion    Hip extension    Hip abduction    Hip adduction    Hip internal rotation     Hip external rotation    Knee flexion    Knee extension    Ankle dorsiflexion    Ankle plantarflexion    Ankle inversion    Ankle eversion     (Blank rows = not tested)  LOWER EXTREMITY MMT:  MMT Right eval Left eval  Hip flexion    Hip extension    Hip abduction    Hip adduction    Hip internal rotation    Hip external rotation    Knee flexion    Knee extension    Ankle dorsiflexion    Ankle plantarflexion    Ankle inversion    Ankle eversion     (Blank rows = not tested)  LOWER EXTREMITY SPECIAL TESTS:  {LEspecialtests:26242}  FUNCTIONAL TESTS:  {Functional tests:24029}  GAIT: Distance walked: *** Assistive device utilized: {Assistive devices:23999} Level of assistance: {Levels of assistance:24026} Comments: ***                                                                                                                                TREATMENT DATE:  Initial Evaluation & HEP created    PATIENT EDUCATION:  Education details: *** Person educated: {  Person educated:25204} Education method: {Education Method:25205} Education comprehension: {Education Comprehension:25206}  HOME EXERCISE PROGRAM: ***  ASSESSMENT:  CLINICAL IMPRESSION: Patient is a *** y.o. *** who was seen today for physical therapy evaluation and treatment for ***.   OBJECTIVE IMPAIRMENTS: {opptimpairments:25111}.   ACTIVITY LIMITATIONS: {activitylimitations:27494}  PARTICIPATION LIMITATIONS: {participationrestrictions:25113}  PERSONAL FACTORS: {Personal factors:25162} are also affecting patient's functional outcome.   REHAB POTENTIAL: {rehabpotential:25112}  CLINICAL DECISION MAKING: {clinical decision making:25114}  EVALUATION COMPLEXITY: {Evaluation complexity:25115}   GOALS: Goals reviewed with patient? {yes/no:20286}  SHORT TERM GOALS: Target date: *** *** Baseline: Goal status: INITIAL  2.  *** Baseline:  Goal status: INITIAL  3.  *** Baseline:  Goal  status: INITIAL  4.  *** Baseline:  Goal status: INITIAL  5.  *** Baseline:  Goal status: INITIAL  6.  *** Baseline:  Goal status: INITIAL  LONG TERM GOALS: Target date: ***  *** Baseline:  Goal status: INITIAL  2.  *** Baseline:  Goal status: INITIAL  3.  *** Baseline:  Goal status: INITIAL  4.  *** Baseline:  Goal status: INITIAL  5.  *** Baseline:  Goal status: INITIAL  6.  *** Baseline:  Goal status: INITIAL   PLAN:  PT FREQUENCY: {rehab frequency:25116}  PT DURATION: {rehab duration:25117}  PLANNED INTERVENTIONS: {rehab planned interventions:25118::97110-Therapeutic exercises,97530- Therapeutic [id number]- Neuromuscular re-education,97535- Self Rjmz,02859- Manual therapy}  PLAN FOR NEXT SESSION: PIERRETTE Kristeen Sar, PT 12/26/2023, 2:49 PM

## 2023-12-27 NOTE — Therapy (Signed)
 OUTPATIENT PHYSICAL THERAPY UPPER EXTREMITY EVALUATION   Patient Name: Steve Mccarthy MRN: [id number] DOB:[birth year], 58 y.o., male Today's Date: 12/28/2023  END OF SESSION:  PT End of Session - 12/28/23 0927     Visit Number 1    Date for PT Re-Evaluation 02/22/24    Authorization Type Self Pay    PT Start Time 0801    PT Stop Time 0835    PT Time Calculation (min) 34 min    Activity Tolerance Patient tolerated treatment well    Behavior During Therapy Steve Mccarthy for tasks assessed/performed          Past Medical History:  Diagnosis Date   Diabetes mellitus without complication (HCC)    Hypertension    History reviewed. No pertinent surgical history. There are no active problems to display for this patient.   PCP: None  REFERRING PROVIDER: Hinojosa-Clapp, Marcela, MD  REFERRING DIAG: M75.80 (ICD-10-CM) - Other shoulder lesions, unspecified shoulder  THERAPY DIAG:  Pain in left elbow  Chronic pain of both shoulders  Muscle weakness (generalized)  Abnormal posture  Rationale for Evaluation and Treatment: Rehabilitation  ONSET DATE: a few months ago  SUBJECTIVE:                                                                                                                                                                                      SUBJECTIVE STATEMENT: Patient presents with left elbow pain that started a few months ago. He was working under his car and he had to pull hard and that is when his pain started. At rest he does not have pain but when he is doing quick movements or putting weight through the elbow it hurts. Denies numbness and tingling in fingers. Limiting functional activities: working, lifting, putting weight through hands, showering/getting dressed. Patient also has bilateral shoulder pain at night. He tosses and turns at night. The shoulder pain is not all the time. Hand dominance: Right  Interpreter [phone] helped using green  interpretation machine  PERTINENT HISTORY: HTN; DBM  PAIN:  Are you having pain? Yes: NPRS scale: 0(currently) 8-9(worst)/10 Pain location: olecranon up into triceps Pain description: sharp Aggravating factors: See above Relieving factors: Nothing  PRECAUTIONS: None  RED FLAGS: None   WEIGHT BEARING RESTRICTIONS: No  FALLS:  Has patient fallen in last 6 months? 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  DIAGNOSTIC FINDINGS:  He reports he had imaging on his elbow and they reported he had an old fracture. Unable to see results in chart review  PATIENT SURVEYS :  QuickDash:50/100 50%   COGNITION: Overall cognitive status: Within functional limits for tasks assessed     SENSATION: WFL  POSTURE: Rounded shoulders  UPPER EXTREMITY ROM: *pain  Active ROM Right eval Left eval  Shoulder flexion    Shoulder extension    Shoulder abduction    Shoulder adduction    Shoulder internal rotation    Shoulder external rotation    Elbow flexion    Elbow extension    Wrist flexion [facility] WFL put painful *  Wrist extension WFL Lacking  5 degrees of extn*  Wrist ulnar deviation    Wrist radial deviation    Wrist pronation    Wrist supination    (Blank rows = not tested)  UPPER EXTREMITY MMT:  MMT Right eval Left eval  Shoulder flexion 4+ 4-*  Shoulder extension    Shoulder abduction 5 4+  Shoulder adduction    Shoulder internal rotation    Shoulder external rotation    Middle trapezius    Lower trapezius    Elbow flexion 5 4  Elbow  extension 5 4-*  Wrist flexion    Wrist extension    Wrist ulnar deviation    Wrist radial deviation    Wrist pronation    Wrist supination  limited  Grip strength (lbs) 90 35*  (Blank rows = not tested)   JOINT MOBILITY TESTING:   Hard end feel of elbow extension and wrist supination  PALPATION:  Tenderness with palpation olecranon and triceps insertion Pain at end range elbow flexion and extension                                                                                                                             TREATMENT DATE:  12/28/2023 Initial Evaluation & HEP created   PATIENT EDUCATION: Education details: PT eval findings, anticipated POC, progress with PT, and initial HEP Person educated: Patient Education method: Explanation, Demonstration, and Handouts Education comprehension: verbalized understanding, returned demonstration, and needs further education  HOME EXERCISE PROGRAM: Access Code: [email or web address] URL: [email or web address] Date: 12/28/2023 Prepared by: Kristeen Sar  Exercises - Standing Overhead Triceps Stretch  - 1 x daily - 7 x weekly - 2 sets - 20-30s hold - Seated Gripping Towel  - 1 x daily - 7 x weekly - 1-2 sets - 5 reps - 5 hold - Seated Scapular Retraction  - 1 x daily - 7 x weekly - 1 sets - 10 reps - Standing Shoulder Row with Anchored Resistance  - 1 x daily - 7 x weekly - 2 sets - 10 reps  ASSESSMENT:  CLINICAL IMPRESSION: Patient is a 58 y.o. male who was seen today for physical therapy evaluation and treatment for left wrist  pain and bilateral shoulder pain. [name] presents to skilled therapy with left wrist pain that began a few months ago after he was doing work on a car. Since then he has not been able to fully extend his elbow, he was pain with weight bearing, and he is not able to perform his work duties. Based on evaluation noted decreased elbow ROM, decreased grip strength, and decreased joint mobility. Educated  patient on correct seated posture and energy conservation techniques. Patient will benefit from skilled PT to address the below impairments and improve overall function.    OBJECTIVE IMPAIRMENTS: decreased ROM, decreased strength, hypomobility, increased muscle spasms, impaired flexibility, impaired tone, impaired UE functional use, postural dysfunction, and pain.   ACTIVITY LIMITATIONS: carrying, lifting, sleeping, bathing, dressing, reach over head, and hygiene/grooming  PARTICIPATION LIMITATIONS: meal prep, cleaning, laundry, interpersonal relationship, driving, shopping, community activity, and occupation  PERSONAL FACTORS: Fitness, Time since onset of injury/illness/exacerbation, and 1-2 comorbidities: HTN;DB are also affecting patient's functional outcome.   REHAB POTENTIAL: Good  CLINICAL DECISION MAKING: Evolving/moderate complexity  EVALUATION COMPLEXITY: Moderate  GOALS: Goals reviewed with patient? Yes  SHORT TERM GOALS: Target date: 01/25/2024  Patient will be independent with initial HEP. Baseline:  Goal status: INITIAL  2.  Patient will report > or = to 30% improvement in left elbow pain since starting PT. Baseline:  Goal status: INITIAL  3.  Patient will report 25% improvement in sleep quality since starting therapy. Baseline:  Goal status: INITIAL   LONG TERM GOALS: Target date: 02/22/2024  Patient will demonstrate independence in advanced HEP. Baseline:  Goal status: INITIAL  2.  Patient will report > or = to 50% improvement in left elbow pain since starting PT. Baseline:  Goal status: INITIAL  3.  Patient will be able to perform bending and lifting activities with < or = to 2/10 elbow pain . Baseline:  Goal status: INITIAL  4.  Patient will demonstrate at least a 15lb improvement in left grip strength to help performance of ADLs and functional activities. Baseline: 35lb  Goal status: INITIAL  5.  Patient will score > or = to 60/100 on QuickDash  due to improved function of UEs. Baseline: 50/100 Goal status: INITIAL    PLAN: PT FREQUENCY: 1x/week  PT DURATION: 8 weeks  PLANNED INTERVENTIONS: 97164- PT Re-evaluation, 97110-Therapeutic exercises, 97530- Therapeutic activity, 97112- Neuromuscular re-education, 97535- Self Care, 02859- Manual therapy, [phone]- Canalith repositioning, [id number]- Aquatic Therapy, [phone]- Electrical stimulation (unattended), [phone]- Electrical stimulation (manual), [id number]- Ultrasound, [id number]- Traction (mechanical), [id number]- Ionotophoresis 4mg /ml Dexamethasone, 79439 (1-2 muscles), 20561 (3+ muscles)- Dry Needling, Patient/Family education, Balance training, Stair training, Taping, Joint mobilization, Joint manipulation, Spinal manipulation, Spinal mobilization, Vestibular training, Cryotherapy, and Moist heat  PLAN FOR NEXT SESSION: Review HEP; contract relax elbow flexion/ extension; manual to triceps; grip strength; posture   Kristeen Sar, PT 12/28/2023, 9:38 AM

## 2023-12-28 ENCOUNTER — Encounter: Payer: Self-pay | Admitting: Physical Therapy

## 2023-12-28 ENCOUNTER — Ambulatory Visit: Payer: Self-pay | Admitting: Physical Therapy

## 2023-12-28 ENCOUNTER — Other Ambulatory Visit: Payer: Self-pay

## 2023-12-28 DIAGNOSIS — R293 Abnormal posture: Secondary | ICD-10-CM

## 2023-12-28 DIAGNOSIS — G8929 Other chronic pain: Secondary | ICD-10-CM

## 2023-12-28 DIAGNOSIS — M6281 Muscle weakness (generalized): Secondary | ICD-10-CM

## 2023-12-28 DIAGNOSIS — M25522 Pain in left elbow: Secondary | ICD-10-CM

## 2024-01-11 ENCOUNTER — Ambulatory Visit: Payer: Self-pay | Attending: Family Medicine | Admitting: Physical Therapy

## 2024-01-11 DIAGNOSIS — R293 Abnormal posture: Secondary | ICD-10-CM | POA: Insufficient documentation

## 2024-01-11 DIAGNOSIS — M25522 Pain in left elbow: Secondary | ICD-10-CM | POA: Insufficient documentation

## 2024-01-11 DIAGNOSIS — M25512 Pain in left shoulder: Secondary | ICD-10-CM | POA: Insufficient documentation

## 2024-01-11 DIAGNOSIS — M25511 Pain in right shoulder: Secondary | ICD-10-CM | POA: Insufficient documentation

## 2024-01-11 DIAGNOSIS — G8929 Other chronic pain: Secondary | ICD-10-CM | POA: Insufficient documentation

## 2024-01-11 DIAGNOSIS — M6281 Muscle weakness (generalized): Secondary | ICD-10-CM | POA: Insufficient documentation

## 2024-01-11 NOTE — Therapy (Signed)
 OUTPATIENT PHYSICAL THERAPY UPPER EXTREMITY PROGRESS NOTE   Patient Name: Steve Mccarthy MRN: [id number] DOB:[birth year], 58 y.o., male Today's Date: 01/11/2024  END OF SESSION:  PT End of Session - 01/11/24 0802     Visit Number 2    Date for PT Re-Evaluation 02/22/24    Authorization Type Self Pay    PT Start Time 0803    PT Stop Time 0840    PT Time Calculation (min) 37 min    Activity Tolerance Patient tolerated treatment well          Past Medical History:  Diagnosis Date   Diabetes mellitus without complication (HCC)    Hypertension    No past surgical history on file. There are no active problems to display for this patient.   PCP: None  REFERRING PROVIDER: Hinojosa-Clapp, Marcela, MD  REFERRING DIAG: M75.80 (ICD-10-CM) - Other shoulder lesions, unspecified shoulder  THERAPY DIAG:  Pain in left elbow  Chronic pain of both shoulders  Muscle weakness (generalized)  Abnormal posture  Rationale for Evaluation and Treatment: Rehabilitation  ONSET DATE: a few months ago  SUBJECTIVE:                                                                                                                                                                                      SUBJECTIVE STATEMENT: Interpreter services used with green interpretation machine until in-person arrived at 8:15.  Reports pain on the lateral epicondyle and popping with movement that concerns him.   Hurts to bear weight through his arm.  States he's been doing the exercises OK.   EVAL: Patient presents with left elbow pain that started a few months ago. He was working under his car and he had to pull hard and that is when his pain started. At rest he does not have pain but when he is doing quick movements or putting weight through the elbow it hurts. Denies numbness and tingling in fingers. Limiting functional activities: working, lifting, putting weight through hands, showering/getting  dressed. Patient also has bilateral shoulder pain at night. He tosses and turns at night. The shoulder pain is not all the time. Hand dominance: Right   PERTINENT HISTORY: HTN; DBM  PAIN: 9/11 Are you having pain? Yes: NPRS scale: 0(currently) 8-9(worst)/10 Pain location: olecranon up into triceps Pain description: sharp Aggravating factors: See above Relieving factors: Nothing  PRECAUTIONS: None  RED FLAGS: None   WEIGHT BEARING RESTRICTIONS: No  FALLS:  Has patient fallen in last 6 months? 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  DIAGNOSTIC FINDINGS:  He reports he had imaging on his elbow and they reported he had an old fracture. Unable to see results in chart review  PATIENT SURVEYS :  QuickDash:50/100 50%   COGNITION: Overall cognitive status: Within functional limits for tasks assessed     SENSATION: WFL  POSTURE: Rounded shoulders  UPPER EXTREMITY ROM: *pain  Active ROM Right eval Left eval  Shoulder flexion    Shoulder extension    Shoulder abduction    Shoulder adduction    Shoulder internal rotation    Shoulder external rotation    Elbow flexion    Elbow extension    Wrist flexion [facility] WFL put painful *  Wrist extension WFL Lacking  5 degrees of extn*  Wrist ulnar deviation    Wrist radial deviation    Wrist pronation    Wrist supination    (Blank rows = not tested)  UPPER EXTREMITY MMT:  MMT Right eval Left eval  Shoulder flexion 4+ 4-*  Shoulder extension    Shoulder abduction 5 4+  Shoulder adduction     Shoulder internal rotation    Shoulder external rotation    Middle trapezius    Lower trapezius    Elbow flexion 5 4  Elbow extension 5 4-*  Wrist flexion    Wrist extension    Wrist ulnar deviation    Wrist radial deviation    Wrist pronation    Wrist supination  limited  Grip strength (lbs) 90 35*  (Blank rows = not tested)   JOINT MOBILITY TESTING:   Hard end feel of elbow extension and wrist supination  PALPATION:  Tenderness with palpation olecranon and triceps insertion Pain at end range elbow flexion and extension                                                                                                                             TREATMENT DATE:  01/11/24: Instrument assisted myofascial release to triceps, biceps, forearm extensors Supine passive ROM assessment: pain with elbow flexion/extension with a clunk on extension Contract relax biceps to assist with elbow extension 5 sec hold 10x (pt reports increased elbow pain as he tends to push with max effort) KT tape anchored to distal triceps and wrapped around elbow to forearm extensors; instructed in wear time of 2 days or remove sooner if not comfortable 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, historic buildings) strategies: wearing elbow support sleeve (already has but hasn't tried), heat before ROM; ice after activity/heavy use Instruction on submax isometrics 5 sec hold 5-10: elbow flexion, elbow extension, pronation and supination (Added to HEP- see below)  12/28/2023 Initial Evaluation & HEP created   PATIENT EDUCATION: Education details: PT eval findings, anticipated POC, progress with  PT, and initial HEP Person educated: Patient Education method: Explanation, Demonstration, and Handouts Education comprehension: verbalized understanding, returned demonstration, and needs further education  HOME EXERCISE PROGRAM: Access Code: [email or web address] URL: [email or web address] Date: 01/11/2024 Prepared by: Glade Pesa  Exercises - Standing Overhead Triceps Stretch  - 1 x daily - 7 x weekly - 2 sets - 20-30s hold - Seated Gripping Towel  - 1 x daily - 7 x weekly - 1-2 sets - 5 reps - 5 hold - Seated Scapular Retraction  - 1 x daily - 7 x weekly - 1 sets - 10 reps - Standing Shoulder Row with Anchored Resistance  - 1 x daily - 7 x weekly - 2 sets - 10 reps - Seated Isometric Elbow Flexion  - 1 x daily - 7 x weekly - 1 sets - 10 reps - 10 hold - Seated Isometric Elbow Extension  - 1 x daily - 7 x weekly - 1 sets - 10 reps - 10 hold - Forearm Pronation and Supination Caregiver PROM  - 1 x daily - 7 x weekly - 1 sets - 10 reps  ASSESSMENT:  CLINICAL IMPRESSION: Steve Mccarthy reports severe lateral elbow pain in the areas of the lateral epicondyle, distal triceps and distal biceps although he is not point tender in these areas.  Elbow extension is especially painful with a clunk near endrange.  Patient was instructed in isometric strengthening for muscular stabilization of the elbow.  Therapist providing verbal cues to optimize technique with exercises in order to achieve the greatest benefit and to provide verbal cues including submax effort with isometrics.  He reports some initial relief with KT tape.       Eval: Patient is a 58 y.o. male who was seen today for physical therapy evaluation and treatment for left wrist pain and bilateral shoulder pain. Jesaiah presents to skilled therapy with left wrist pain that began a few months ago after he was doing work on a car. Since then he has not been able to fully extend his elbow, he was pain with weight bearing, and he is not able to perform his work duties. Based on evaluation noted decreased elbow ROM, decreased grip strength, and decreased joint mobility. Educated patient on correct seated posture and energy conservation techniques. Patient will benefit from skilled PT to address the below impairments and improve overall function.    OBJECTIVE IMPAIRMENTS:  decreased ROM, decreased strength, hypomobility, increased muscle spasms, impaired flexibility, impaired tone, impaired UE functional use, postural dysfunction, and pain.   ACTIVITY LIMITATIONS: carrying, lifting, sleeping, bathing, dressing, reach over head, and hygiene/grooming  PARTICIPATION LIMITATIONS: meal prep, cleaning, laundry, interpersonal relationship, driving, shopping, community activity, and occupation  PERSONAL FACTORS: Fitness, Time since onset of injury/illness/exacerbation, and 1-2 comorbidities: HTN;DB are also affecting patient's functional outcome.   REHAB POTENTIAL: Good  CLINICAL DECISION MAKING: Evolving/moderate complexity  EVALUATION COMPLEXITY: Moderate  GOALS: Goals reviewed with patient? Yes  SHORT TERM GOALS: Target date: 01/25/2024  Patient will be independent with initial HEP. Baseline:  Goal status: INITIAL  2.  Patient will report > or = to 30% improvement in left elbow pain since starting PT. Baseline:  Goal status: INITIAL  3.  Patient will report 25% improvement in sleep quality since starting therapy. Baseline:  Goal status: INITIAL   LONG TERM GOALS: Target date: 02/22/2024  Patient will demonstrate independence in advanced HEP. Baseline:  Goal status: INITIAL  2.  Patient will report > or = to 50% improvement in left elbow  pain since starting PT. Baseline:  Goal status: INITIAL  3.  Patient will be able to perform bending and lifting activities with < or = to 2/10 elbow pain . Baseline:  Goal status: INITIAL  4.  Patient will demonstrate at least a 15lb improvement in left grip strength to help performance of ADLs and functional activities. Baseline: 35lb  Goal status: INITIAL  5.  Patient will score > or = to 60/100 on QuickDash due to improved function of UEs. Baseline: 50/100 Goal status: INITIAL    PLAN: PT FREQUENCY: 1x/week  PT DURATION: 8 weeks  PLANNED INTERVENTIONS: 97164- PT Re-evaluation, 97110-Therapeutic  exercises, 97530- Therapeutic activity, 97112- Neuromuscular re-education, 97535- Self Care, 02859- Manual therapy, [phone]- Canalith repositioning, [id number]- Aquatic Therapy, [phone]- Electrical stimulation (unattended), [phone]- Electrical stimulation (manual), [id number]- Ultrasound, [id number]- Traction (mechanical), [id number]- Ionotophoresis 4mg /ml Dexamethasone, 79439 (1-2 muscles), 20561 (3+ muscles)- Dry Needling, Patient/Family education, Balance training, Stair training, Taping, Joint mobilization, Joint manipulation, Spinal manipulation, Spinal mobilization, Vestibular training, Cryotherapy, and Moist heat  PLAN FOR NEXT SESSION: see how KT tape went; Review HEP including submax isometrics; contract relax elbow flexion/ extension; manual to triceps; grip strength; posture   Glade Pesa, PT 01/11/24 9:54 AM Phone: [phone] Fax: [phone]

## 2024-01-15 ENCOUNTER — Encounter: Payer: Self-pay | Admitting: Rehabilitative and Restorative Service Providers"

## 2024-01-15 ENCOUNTER — Ambulatory Visit: Payer: Self-pay | Admitting: Rehabilitative and Restorative Service Providers"

## 2024-01-15 DIAGNOSIS — G8929 Other chronic pain: Secondary | ICD-10-CM

## 2024-01-15 DIAGNOSIS — M25522 Pain in left elbow: Secondary | ICD-10-CM

## 2024-01-15 DIAGNOSIS — R293 Abnormal posture: Secondary | ICD-10-CM

## 2024-01-15 DIAGNOSIS — M6281 Muscle weakness (generalized): Secondary | ICD-10-CM

## 2024-01-15 NOTE — Therapy (Signed)
 OUTPATIENT PHYSICAL THERAPY TREATMENT NOTE   Patient Name: Steve Mccarthy MRN: [id number] DOB:06-29-1965, 58 y.o., male Today's Date: 01/15/2024  END OF SESSION:  PT End of Session - 01/15/24 0805     Visit Number 3    Date for PT Re-Evaluation 02/22/24    Authorization Type Self Pay    PT Start Time 0800    PT Stop Time 0840    PT Time Calculation (min) 40 min    Activity Tolerance Patient tolerated treatment well    Behavior During Therapy WFL for tasks assessed/performed          Past Medical History:  Diagnosis Date   Diabetes mellitus without complication (HCC)    Hypertension    History reviewed. No pertinent surgical history. There are no active problems to display for this patient.   PCP: None  REFERRING PROVIDER: Hinojosa-Clapp, Marcela, MD  REFERRING DIAG: M75.80 (ICD-10-CM) - Other shoulder lesions, unspecified shoulder  THERAPY DIAG:  Pain in left elbow  Chronic pain of both shoulders  Muscle weakness (generalized)  Abnormal posture  Rationale for Evaluation and Treatment: Rehabilitation  ONSET DATE: a few months ago  SUBJECTIVE:                                                                                                                                                                                      SUBJECTIVE STATEMENT: Interpreter services used with green interpretation machine until in-person arrived at 8:15.  Reports pain on the lateral epicondyle and popping with movement that concerns him.   Hurts to bear weight through his arm.  States he's been doing the exercises OK.   EVAL: Patient presents with left elbow pain that started a few months ago. He was working under his car and he had to pull hard and that is when his pain started. At rest he does not have pain but when he is doing quick movements or putting weight through the elbow it hurts. Denies numbness and tingling in fingers. Limiting functional activities:  working, lifting, putting weight through hands, showering/getting dressed. Patient also has bilateral shoulder pain at night. He tosses and turns at night. The shoulder pain is not all the time. Hand dominance: Right   PERTINENT HISTORY: HTN; DBM  PAIN:  Are you having pain? Yes: NPRS scale: 8/10 Pain location: olecranon up into triceps Pain description: intermittent, sharp Aggravating factors: See above Relieving factors: Nothing  PRECAUTIONS: None  RED FLAGS: None   WEIGHT BEARING RESTRICTIONS: No  FALLS:  Has patient fallen in last 6 months? No  LIVING ENVIRONMENT: Lives with: lives with their family Lives in: House/apartment  OCCUPATION: Remodels homes  PLOF:  Independent, Independent with basic ADLs, Independent with household mobility without device, Independent with community mobility without device, Independent with gait, and Independent with transfers  PATIENT GOALS: To decrease pain  NEXT MD VISIT: PRN  OBJECTIVE:  Note: Objective measures were completed at Evaluation unless otherwise noted.  DIAGNOSTIC FINDINGS:   Left Elbow Radiograph on 12/18/23: IMPRESSION:  No acute osseous abnormalities.  Degenerative changes in the elbow as described.  Old healed fracture of the radial neck.   Right foot radiograph on 12/18/23: IMPRESSION:  No acute osseous abnormalities.  Inferior calcaneal spur. Heel edema.  Mild degenerative changes in the midfoot.  Small osteochondroma distal first metatarsal.   PATIENT SURVEYS :  Eval:  QuickDash:50/100 50%   COGNITION: Overall cognitive status: Within functional limits for tasks assessed     SENSATION: WFL  POSTURE: Rounded shoulders  UPPER EXTREMITY ROM: *pain  Active ROM Right eval Left eval  Shoulder flexion    Shoulder extension    Shoulder abduction    Shoulder adduction    Shoulder internal rotation    Shoulder external rotation    Elbow flexion    Elbow extension    Wrist flexion [facility] WFL put  painful *  Wrist extension WFL Lacking  5 degrees of extn*  Wrist ulnar deviation    Wrist radial deviation    Wrist pronation    Wrist supination    (Blank rows = not tested)  UPPER EXTREMITY MMT:  MMT Right eval Left eval  Shoulder flexion 4+ 4-*  Shoulder extension    Shoulder abduction 5 4+  Shoulder adduction    Shoulder internal rotation    Shoulder external rotation    Middle trapezius    Lower trapezius    Elbow flexion 5 4  Elbow extension 5 4-*  Wrist flexion    Wrist extension    Wrist ulnar deviation    Wrist radial deviation    Wrist pronation    Wrist supination  limited  Grip strength (lbs) 90 35*  (Blank rows = not tested)   JOINT MOBILITY TESTING:   Hard end feel of elbow extension and wrist supination  PALPATION:  Tenderness with palpation olecranon and triceps insertion Pain at end range elbow flexion and extension                                                                                                                             TREATMENT DATE:  01/15/2024: UBE level 1.0 x3 min each direction with PT present to discuss status and pain Seated red flex bar for grip fwd/backwards x1 min Seated supination/pronation with 2x10 left UE Seated isometric bicep contraction 2x10 (cuing for submaximal force) Standing ulnar nerve flossing (hand in snake movement) to left UE  2x10 Standing ulnar nerve glide with hands in prayer stretch position to left UE 2x10 Seated triceps isometric contraction to left UE 2x10 (cuing for submaximal force) Seated triceps stretch 2x20 sec left UE Education on  ice massage and the potential benefits.  Patient able to perform ice massage with cryostem to left elbow with ice massage tool x 8 min. Education and performance of soft tissue mobilization and manual trigger point release Seated scapular retraction x10   01/11/24: Instrument assisted myofascial release to triceps, biceps, forearm extensors Supine passive ROM  assessment: pain with elbow flexion/extension with a clunk on extension Contract relax biceps to assist with elbow extension 5 sec hold 10x (pt reports increased elbow pain as he tends to push with max effort) KT tape anchored to distal triceps and wrapped around elbow to forearm extensors; instructed in wear time of 2 days or remove sooner if not comfortable 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, historic buildings) strategies: wearing elbow support sleeve (already has but hasn't tried), heat before ROM; ice after activity/heavy use Instruction on submax isometrics 5 sec hold 5-10: elbow flexion, elbow extension, pronation and supination (Added to HEP- see below)     PATIENT EDUCATION: Education details: PT eval findings, anticipated POC, progress with PT, and initial HEP Person educated: Patient Education method: Explanation, Demonstration, and Handouts Education comprehension: verbalized understanding, returned demonstration, and needs further education  HOME EXERCISE PROGRAM: Access Code: [email or web address] URL: [email or web address] Date: 01/15/2024 Prepared by: Jarrell Alan Drummer  Exercises - Standing Overhead Triceps Stretch  - 1 x daily - 7 x weekly - 2 sets - 20-30s hold - Seated Gripping Towel  - 1 x daily - 7 x weekly - 1-2 sets - 5 reps - 5 hold - Seated Scapular Retraction  - 1 x daily - 7 x weekly - 1 sets - 10 reps - Standing Shoulder Row with Anchored Resistance  - 1 x daily - 7 x weekly - 2 sets - 10 reps - Seated Isometric Elbow Flexion  - 1 x daily - 7 x weekly - 1 sets - 10 reps - 10 hold - Seated Isometric Elbow Extension  - 1 x daily - 7 x weekly - 1 sets - 10 reps - 10 hold - Forearm Pronation and Supination Caregiver PROM  - 1 x daily - 7 x weekly - 1 sets - 10 reps - Ulnar Nerve Flossing  - 1 x daily - 7 x weekly - 2 sets - 10 reps - Ulnar Nerve Flossing with hands in prayer stretch  - 1 x daily - 7 x weekly - 2 sets - 10 reps  Patient Education - Ice  Massage  ASSESSMENT:  CLINICAL IMPRESSION: Deanna presents to skilled PT reporting that he continues to have pain in his left elbow area that can be quite severe in nature.  Patient reports that the pain goes from his shoulder down his arm.  Patient educated on ulnar nerve gliding/flossing and performed 2 different varieties in clinic today.  Additionally educated patient on ice massage for manual therapy to also utilize the properties of the cold to assist with decreased inflammation and decreased pain.  Patient did report that this felt good and he had decreased pain after to 6/10.  Patient provided with updated HEP with ulnar nerve stretches and ice massage to allow him to perform these throughout the day to assist with decreased pain.  Patient continues to require skilled PT to progress towards goal related activities.    OBJECTIVE IMPAIRMENTS: decreased ROM, decreased strength, hypomobility, increased muscle spasms, impaired flexibility, impaired tone, impaired UE functional use, postural dysfunction, and pain.   ACTIVITY LIMITATIONS: carrying, lifting, sleeping, bathing, dressing, reach over head, and hygiene/grooming  PARTICIPATION LIMITATIONS: meal prep, cleaning,  laundry, interpersonal relationship, driving, shopping, community activity, and occupation  PERSONAL FACTORS: Fitness, Time since onset of injury/illness/exacerbation, and 1-2 comorbidities: HTN;DB are also affecting patient's functional outcome.   REHAB POTENTIAL: Good  CLINICAL DECISION MAKING: Evolving/moderate complexity  EVALUATION COMPLEXITY: Moderate  GOALS: Goals reviewed with patient? Yes  SHORT TERM GOALS: Target date: 01/25/2024  Patient will be independent with initial HEP. Baseline:  Goal status: Met on 01/15/24  2.  Patient will report > or = to 30% improvement in left elbow pain since starting PT. Baseline:  Goal status: Ongoing  3.  Patient will report 25% improvement in sleep quality since  starting therapy. Baseline:  Goal status: INITIAL   LONG TERM GOALS: Target date: 02/22/2024  Patient will demonstrate independence in advanced HEP. Baseline:  Goal status: INITIAL  2.  Patient will report > or = to 50% improvement in left elbow pain since starting PT. Baseline:  Goal status: INITIAL  3.  Patient will be able to perform bending and lifting activities with < or = to 2/10 elbow pain . Baseline:  Goal status: INITIAL  4.  Patient will demonstrate at least a 15lb improvement in left grip strength to help performance of ADLs and functional activities. Baseline: 35lb  Goal status: INITIAL  5.  Patient will score > or = to 60/100 on QuickDash due to improved function of UEs. Baseline: 50/100 Goal status: INITIAL    PLAN: PT FREQUENCY: 1x/week  PT DURATION: 8 weeks  PLANNED INTERVENTIONS: 97164- PT Re-evaluation, 97110-Therapeutic exercises, 97530- Therapeutic activity, 97112- Neuromuscular re-education, 97535- Self Care, 02859- Manual therapy, [phone]- Canalith repositioning, [id number]- Aquatic Therapy, [phone]- Electrical stimulation (unattended), [phone]- Electrical stimulation (manual), [id number]- Ultrasound, [id number]- Traction (mechanical), [id number]- Ionotophoresis 4mg /ml Dexamethasone, 79439 (1-2 muscles), 20561 (3+ muscles)- Dry Needling, Patient/Family education, Balance training, Stair training, Taping, Joint mobilization, Joint manipulation, Spinal manipulation, Spinal mobilization, Vestibular training, Cryotherapy, and Moist heat  PLAN FOR NEXT SESSION: Review HEP including submax isometrics; contract relax elbow flexion/ extension; manual to triceps; grip strength; posture     Jarrell Laming, PT, DPT 01/15/24, 8:47 AM  [facility] [address], [address] [place], [place] [address] Phone # [phone] Fax [phone]

## 2024-01-22 ENCOUNTER — Ambulatory Visit: Payer: Self-pay | Admitting: Rehabilitative and Restorative Service Providers"

## 2024-01-22 ENCOUNTER — Encounter: Payer: Self-pay | Admitting: Rehabilitative and Restorative Service Providers"

## 2024-01-22 DIAGNOSIS — M6281 Muscle weakness (generalized): Secondary | ICD-10-CM

## 2024-01-22 DIAGNOSIS — G8929 Other chronic pain: Secondary | ICD-10-CM

## 2024-01-22 DIAGNOSIS — R293 Abnormal posture: Secondary | ICD-10-CM

## 2024-01-22 DIAGNOSIS — M25522 Pain in left elbow: Secondary | ICD-10-CM

## 2024-01-22 NOTE — Therapy (Signed)
 OUTPATIENT PHYSICAL THERAPY TREATMENT NOTE   Patient Name: Steve Mccarthy MRN: [id number] DOB:1965-07-12, 58 y.o., male Today's Date: 01/22/2024  END OF SESSION:  PT End of Session - 01/22/24 0802     Visit Number 4    Date for Recertification  02/22/24    Authorization Type Self Pay    PT Start Time 0802    PT Stop Time 0840    PT Time Calculation (min) 38 min    Activity Tolerance Patient tolerated treatment well    Behavior During Therapy [facility] for tasks assessed/performed          Past Medical History:  Diagnosis Date   Diabetes mellitus without complication (HCC)    Hypertension    History reviewed. No pertinent surgical history. There are no active problems to display for this patient.   PCP: None  REFERRING PROVIDER: Hinojosa-Clapp, Marcela, MD  REFERRING DIAG: M75.80 (ICD-10-CM) - Other shoulder lesions, unspecified shoulder  THERAPY DIAG:  Pain in left elbow  Chronic pain of both shoulders  Muscle weakness (generalized)  Abnormal posture  Rationale for Evaluation and Treatment: Rehabilitation  ONSET DATE: a few months ago  SUBJECTIVE:                                                                                                                                                                                      SUBJECTIVE STATEMENT: Patient reports that he is doing okay.  States that he is still having 7/10 pain.  States that he has gotten a cryostem device to use on his arm and that has been helping.   EVAL: Patient presents with left elbow pain that started a few months ago. He was working under his car and he had to pull hard and that is when his pain started. At rest he does not have pain but when he is doing quick movements or putting weight through the elbow it hurts. Denies numbness and tingling in fingers. Limiting functional activities: working, lifting, putting weight through hands, showering/getting dressed. Patient also has  bilateral shoulder pain at night. He tosses and turns at night. The shoulder pain is not all the time. Hand dominance: Right   PERTINENT HISTORY: HTN; DBM  PAIN:  Are you having pain? Yes: NPRS scale: 7/10 Pain location: olecranon up into triceps Pain description: intermittent, sharp Aggravating factors: See above Relieving factors: Nothing  PRECAUTIONS: None  RED FLAGS: None   WEIGHT BEARING RESTRICTIONS: No  FALLS:  Has patient fallen in last 6 months? 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  DIAGNOSTIC FINDINGS:   Left Elbow Radiograph on 12/18/23: IMPRESSION:  No acute osseous abnormalities.  Degenerative changes in the elbow as described.  Old healed fracture of the radial neck.   Right foot radiograph on 12/18/23: IMPRESSION:  No acute osseous abnormalities.  Inferior calcaneal spur. Heel edema.  Mild degenerative changes in the midfoot.  Small osteochondroma distal first metatarsal.   PATIENT SURVEYS :  Eval:  QuickDash:50/100 50%   COGNITION: Overall cognitive status: Within functional limits for tasks assessed     SENSATION: WFL  POSTURE: Rounded shoulders  UPPER EXTREMITY ROM: *pain  Active ROM Right eval Left eval  Shoulder flexion    Shoulder extension    Shoulder abduction    Shoulder adduction    Shoulder internal rotation    Shoulder external rotation    Elbow flexion    Elbow extension    Wrist flexion [facility] WFL put painful *  Wrist extension WFL Lacking  5 degrees of extn*  Wrist ulnar deviation    Wrist  radial deviation    Wrist pronation    Wrist supination    (Blank rows = not tested)  UPPER EXTREMITY MMT:  MMT Right eval Left eval  Shoulder flexion 4+ 4-*  Shoulder extension    Shoulder abduction 5 4+  Shoulder adduction    Shoulder internal rotation    Shoulder external rotation    Middle trapezius    Lower trapezius    Elbow flexion 5 4  Elbow extension 5 4-*  Wrist flexion    Wrist extension    Wrist ulnar deviation    Wrist radial deviation    Wrist pronation    Wrist supination  limited  Grip strength (lbs) 90 35*  (Blank rows = not tested)   JOINT MOBILITY TESTING:   Hard end feel of elbow extension and wrist supination  PALPATION:  Tenderness with palpation olecranon and triceps insertion Pain at end range elbow flexion and extension                                                                                                                             TREATMENT DATE:  01/22/2024: Seated isometric bicep contraction 2x10 (cuing for submaximal force) Seated ulnar nerve flossing (hand in snake movement) to left UE  x10 Seated ulnar nerve glide with hands in prayer stretch position moving side to side x10 Seated triceps isometric contraction to left UE 2x10 (cuing for submaximal force) Seated triceps stretch 2x20 sec left UE Seated red flex bar for grip fwd/backwards x1 min Seated scapular retraction 2x10 Seated supination/pronation with 1# dumbbell 2x10 left UE Seated ulnar/radial deviation with 1# dumbbell 2x10 left UE Manual Therapy:  soft tissue mobilization and manual trigger  point release to left forearm and triceps, as well as bilateral upper trap region to promote decreased pain and improved tissue mobility.    01/15/2024: UBE level 1.0 x3 min each direction with PT present to discuss status and pain Seated red flex bar for grip fwd/backwards x1 min Seated supination/pronation with 2x10 left UE Seated isometric bicep contraction 2x10 (cuing for  submaximal force) Standing ulnar nerve flossing (hand in snake movement) to left UE  2x10 Standing ulnar nerve glide with hands in prayer stretch position to left UE 2x10 Seated triceps isometric contraction to left UE 2x10 (cuing for submaximal force) Seated triceps stretch 2x20 sec left UE Education on ice massage and the potential benefits.  Patient able to perform ice massage with cryostem to left elbow with ice massage tool x 8 min. Education and performance of soft tissue mobilization and manual trigger point release Seated scapular retraction x10   01/11/24: Instrument assisted myofascial release to triceps, biceps, forearm extensors Supine passive ROM assessment: pain with elbow flexion/extension with a clunk on extension Contract relax biceps to assist with elbow extension 5 sec hold 10x (pt reports increased elbow pain as he tends to push with max effort) KT tape anchored to distal triceps and wrapped around elbow to forearm extensors; instructed in wear time of 2 days or remove sooner if not comfortable 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, historic buildings) strategies: wearing elbow support sleeve (already has but hasn't tried), heat before ROM; ice after activity/heavy use Instruction on submax isometrics 5 sec hold 5-10: elbow flexion, elbow extension, pronation and supination (Added to HEP- see below)     PATIENT EDUCATION: Education details: PT eval findings, anticipated POC, progress with PT, and initial HEP Person educated: Patient Education method: Explanation, Demonstration, and Handouts Education comprehension: verbalized understanding, returned demonstration, and needs further education  HOME EXERCISE PROGRAM: Access Code: [email or web address] URL: [email or web address] Date: 01/15/2024 Prepared by: Jarrell Tonji Elliff  Exercises - Standing Overhead Triceps Stretch  - 1 x daily - 7 x weekly - 2 sets - 20-30s hold - Seated Gripping Towel  - 1 x daily - 7 x weekly - 1-2 sets - 5 reps  - 5 hold - Seated Scapular Retraction  - 1 x daily - 7 x weekly - 1 sets - 10 reps - Standing Shoulder Row with Anchored Resistance  - 1 x daily - 7 x weekly - 2 sets - 10 reps - Seated Isometric Elbow Flexion  - 1 x daily - 7 x weekly - 1 sets - 10 reps - 10 hold - Seated Isometric Elbow Extension  - 1 x daily - 7 x weekly - 1 sets - 10 reps - 10 hold - Forearm Pronation and Supination Caregiver PROM  - 1 x daily - 7 x weekly - 1 sets - 10 reps - Ulnar Nerve Flossing  - 1 x daily - 7 x weekly - 2 sets - 10 reps - Ulnar Nerve Flossing with hands in prayer stretch  - 1 x daily - 7 x weekly - 2 sets - 10 reps  Patient Education - Ice Massage  ASSESSMENT:  CLINICAL IMPRESSION: Tosh presents to skilled PT reporting that he was at a retreat this weekend and he is having some soreness.  Patient states that using the device for ice massage has been helping decrease his pain some.  Patient able to progress with strengthening during session, but continues to have some pain.  Patient with continued nerve stretching noted.  Patient with good response to manual  therapy at end of session and reports that his pain decreases down to 4/10 by end  of session.  Patient did report that he is noticing that his shoulders have not been hurting him as much since he started PT and feels that the exercises are helping.  Patient continues to require skilled PT to progress towards goal related activities.    OBJECTIVE IMPAIRMENTS: decreased ROM, decreased strength, hypomobility, increased muscle spasms, impaired flexibility, impaired tone, impaired UE functional use, postural dysfunction, and pain.   ACTIVITY LIMITATIONS: carrying, lifting, sleeping, bathing, dressing, reach over head, and hygiene/grooming  PARTICIPATION LIMITATIONS: meal prep, cleaning, laundry, interpersonal relationship, driving, shopping, community activity, and occupation  PERSONAL FACTORS: Fitness, Time since onset of  injury/illness/exacerbation, and 1-2 comorbidities: HTN;DB are also affecting patient's functional outcome.   REHAB POTENTIAL: Good  CLINICAL DECISION MAKING: Evolving/moderate complexity  EVALUATION COMPLEXITY: Moderate  GOALS: Goals reviewed with patient? Yes  SHORT TERM GOALS: Target date: 01/25/2024  Patient will be independent with initial HEP. Baseline:  Goal status: Met on 01/15/24  2.  Patient will report > or = to 30% improvement in left elbow pain since starting PT. Baseline:  Goal status: Ongoing  3.  Patient will report 25% improvement in sleep quality since starting therapy. Baseline:  Goal status: IN PROGRESS   LONG TERM GOALS: Target date: 02/22/2024  Patient will demonstrate independence in advanced HEP. Baseline:  Goal status: Ongoing  2.  Patient will report > or = to 50% improvement in left elbow pain since starting PT. Baseline:  Goal status: INITIAL  3.  Patient will be able to perform bending and lifting activities with < or = to 2/10 elbow pain . Baseline:  Goal status: INITIAL  4.  Patient will demonstrate at least a 15lb improvement in left grip strength to help performance of ADLs and functional activities. Baseline: 35lb  Goal status: INITIAL  5.  Patient will score > or = to 60/100 on QuickDash due to improved function of UEs. Baseline: 50/100 Goal status: INITIAL    PLAN: PT FREQUENCY: 1x/week  PT DURATION: 8 weeks  PLANNED INTERVENTIONS: 97164- PT Re-evaluation, 97110-Therapeutic exercises, 97530- Therapeutic activity, 97112- Neuromuscular re-education, 97535- Self Care, 02859- Manual therapy, [phone]- Canalith repositioning, [id number]- Aquatic Therapy, [phone]- Electrical stimulation (unattended), [phone]- Electrical stimulation (manual), [id number]- Ultrasound, [id number]- Traction (mechanical), [id number]- Ionotophoresis 4mg /ml Dexamethasone, 79439 (1-2 muscles), 20561 (3+ muscles)- Dry Needling, Patient/Family education, Balance training, Stair training,  Taping, Joint mobilization, Joint manipulation, Spinal manipulation, Spinal mobilization, Vestibular training, Cryotherapy, and Moist heat  PLAN FOR NEXT SESSION: Review HEP including submax isometrics; contract relax elbow flexion/ extension; manual to triceps; grip strength; posture     Jarrell Laming, PT, DPT 01/22/24, 9:55 AM  [facility] [address] [place], [place] [address] Phone # [phone] Fax [phone]

## 2024-01-29 ENCOUNTER — Encounter: Payer: Self-pay | Admitting: Rehabilitative and Restorative Service Providers"

## 2024-01-29 ENCOUNTER — Ambulatory Visit: Payer: Self-pay | Admitting: Rehabilitative and Restorative Service Providers"

## 2024-01-29 DIAGNOSIS — R293 Abnormal posture: Secondary | ICD-10-CM

## 2024-01-29 DIAGNOSIS — G8929 Other chronic pain: Secondary | ICD-10-CM

## 2024-01-29 DIAGNOSIS — M25522 Pain in left elbow: Secondary | ICD-10-CM

## 2024-01-29 DIAGNOSIS — M6281 Muscle weakness (generalized): Secondary | ICD-10-CM

## 2024-01-29 NOTE — Therapy (Signed)
 OUTPATIENT PHYSICAL THERAPY TREATMENT NOTE   Patient Name: Steve Mccarthy MRN: [id number] DOB:February 17, 1966, 58 y.o., male Today's Date: 01/29/2024  END OF SESSION:  PT End of Session - 01/29/24 0813     Visit Number 5    Date for Recertification  02/22/24    Authorization Type Self Pay    PT Start Time 0810   Pt arrived late   PT Stop Time 0845    PT Time Calculation (min) 35 min    Activity Tolerance Patient tolerated treatment well    Behavior During Therapy [facility] for tasks assessed/performed          Past Medical History:  Diagnosis Date   Diabetes mellitus without complication (HCC)    Hypertension    History reviewed. No pertinent surgical history. There are no active problems to display for this patient.   PCP: None  REFERRING PROVIDER: Hinojosa-Clapp, Marcela, MD  REFERRING DIAG: M75.80 (ICD-10-CM) - Other shoulder lesions, unspecified shoulder  THERAPY DIAG:  Pain in left elbow  Chronic pain of both shoulders  Muscle weakness (generalized)  Abnormal posture  Rationale for Evaluation and Treatment: Rehabilitation  ONSET DATE: a few months ago  SUBJECTIVE:                                                                                                                                                                                      SUBJECTIVE STATEMENT: Patient reports that he is doing okay.  States that he is still having 7/10 pain.  States that he has gotten a cryostem device to use on his arm and that has been helping.   EVAL: Patient presents with left elbow pain that started a few months ago. He was working under his car and he had to pull hard and that is when his pain started. At rest he does not have pain but when he is doing quick movements or putting weight through the elbow it hurts. Denies numbness and tingling in fingers. Limiting functional activities: working, lifting, putting weight through hands, showering/getting dressed.  Patient also has bilateral shoulder pain at night. He tosses and turns at night. The shoulder pain is not all the time. Hand dominance: Right   PERTINENT HISTORY: HTN; DBM  PAIN:  01/29/2024 Are you having pain? Yes: NPRS scale: 7/10 Pain location: olecranon up into triceps Pain description: intermittent, sharp Aggravating factors: See above Relieving factors: Nothing  PRECAUTIONS: None  RED FLAGS: None   WEIGHT BEARING RESTRICTIONS: No  FALLS:  Has patient fallen in last 6 months? No  LIVING ENVIRONMENT: Lives with: lives with their family Lives in: House/apartment  OCCUPATION: Remodels homes  PLOF: Independent, Independent with  basic ADLs, Independent with household mobility without device, Independent with community mobility without device, Independent with gait, and Independent with transfers  PATIENT GOALS: To decrease pain  NEXT MD VISIT: PRN  OBJECTIVE:  Note: Objective measures were completed at Evaluation unless otherwise noted.  DIAGNOSTIC FINDINGS:   Left Elbow Radiograph on 12/18/23: IMPRESSION:  No acute osseous abnormalities.  Degenerative changes in the elbow as described.  Old healed fracture of the radial neck.   Right foot radiograph on 12/18/23: IMPRESSION:  No acute osseous abnormalities.  Inferior calcaneal spur. Heel edema.  Mild degenerative changes in the midfoot.  Small osteochondroma distal first metatarsal.   PATIENT SURVEYS :  Eval:  QuickDash:50/100 50%   COGNITION: Overall cognitive status: Within functional limits for tasks assessed     SENSATION: WFL  POSTURE: Rounded shoulders  UPPER EXTREMITY ROM: *pain  Active ROM Right eval Left eval  Shoulder flexion    Shoulder extension    Shoulder abduction    Shoulder adduction    Shoulder internal rotation    Shoulder external rotation    Elbow flexion    Elbow extension    Wrist flexion [facility] WFL put painful *  Wrist extension WFL Lacking  5 degrees of extn*   Wrist ulnar deviation    Wrist radial deviation    Wrist pronation    Wrist supination    (Blank rows = not tested)  UPPER EXTREMITY MMT:  MMT Right eval Left eval  Shoulder flexion 4+ 4-*  Shoulder extension    Shoulder abduction 5 4+  Shoulder adduction    Shoulder internal rotation    Shoulder external rotation    Middle trapezius    Lower trapezius    Elbow flexion 5 4  Elbow extension 5 4-*  Wrist flexion    Wrist extension    Wrist ulnar deviation    Wrist radial deviation    Wrist pronation    Wrist supination  limited  Grip strength (lbs) 90 35*  (Blank rows = not tested)  01/29/2024: Grip strength:  left- 70#  JOINT MOBILITY TESTING:   Hard end feel of elbow extension and wrist supination  PALPATION:  Tenderness with palpation olecranon and triceps insertion Pain at end range elbow flexion and extension                                                                                                                             TREATMENT DATE:   01/29/2024: UBE level 1.0 x3 min each direction with PT present to discuss status and pain Seated red flex bar for grip fwd/backwards x1 min Seated scapular retraction 2x10 Pt reports at least 50% improvement since starting PT Seated supination/pronation with 1# dumbbell 2x10 left UE Seated ulnar/radial deviation with 1# dumbbell 2x10 left UE Seated ulnar nerve glide with hands in prayer stretch position moving side to side x10 Standing 4D scapular stabilization with 2# light blue plyoball x20 left UE Standing 3  way scapular stabilization/clocks with yellow loop 2x5 bilat Standing wall push ups 2x10 Standing performing alt shoulder taps from barre 2x10 Standing L counter stretch 2 x 20 sec bilat   01/22/2024: Seated isometric bicep contraction 2x10 (cuing for submaximal force) Seated ulnar nerve flossing (hand in snake movement) to left UE  x10 Seated ulnar nerve glide with hands in prayer stretch position  moving side to side x10 Seated triceps isometric contraction to left UE 2x10 (cuing for submaximal force) Seated triceps stretch 2x20 sec left UE Seated red flex bar for grip fwd/backwards x1 min Seated scapular retraction 2x10 Seated supination/pronation with 1# dumbbell 2x10 left UE Seated ulnar/radial deviation with 1# dumbbell 2x10 left UE Manual Therapy:  soft tissue mobilization and manual trigger point release to left forearm and triceps, as well as bilateral upper trap region to promote decreased pain and improved tissue mobility.    01/15/2024: UBE level 1.0 x3 min each direction with PT present to discuss status and pain Seated red flex bar for grip fwd/backwards x1 min Seated supination/pronation with 2x10 left UE Seated isometric bicep contraction 2x10 (cuing for submaximal force) Standing ulnar nerve flossing (hand in snake movement) to left UE  2x10 Standing ulnar nerve glide with hands in prayer stretch position to left UE 2x10 Seated triceps isometric contraction to left UE 2x10 (cuing for submaximal force) Seated triceps stretch 2x20 sec left UE Education on ice massage and the potential benefits.  Patient able to perform ice massage with cryostem to left elbow with ice massage tool x 8 min. Education and performance of soft tissue mobilization and manual trigger point release Seated scapular retraction x10      PATIENT EDUCATION: Education details: PT eval findings, anticipated POC, progress with PT, and initial HEP Person educated: Patient Education method: Explanation, Demonstration, and Handouts Education comprehension: verbalized understanding, returned demonstration, and needs further education  HOME EXERCISE PROGRAM: Access Code: [email or web address] URL: [email or web address] Date: 01/29/2024 Prepared by: Jarrell Laming  Exercises - Standing Overhead Triceps Stretch  - 1 x daily - 7 x weekly - 2 sets - 20-30s hold - Seated Gripping Towel  - 1 x daily  - 7 x weekly - 1-2 sets - 5 reps - 5 hold - Seated Scapular Retraction  - 1 x daily - 7 x weekly - 1 sets - 10 reps - Standing Shoulder Row with Anchored Resistance  - 1 x daily - 7 x weekly - 2 sets - 10 reps - Seated Isometric Elbow Flexion  - 1 x daily - 7 x weekly - 1 sets - 10 reps - 10 hold - Seated Isometric Elbow Extension  - 1 x daily - 7 x weekly - 1 sets - 10 reps - 10 hold - Forearm Pronation and Supination Caregiver PROM  - 1 x daily - 7 x weekly - 1 sets - 10 reps - Ulnar Nerve Flossing  - 1 x daily - 7 x weekly - 2 sets - 10 reps - Ulnar Nerve Flossing  - 1 x daily - 7 x weekly - 2 sets - 10 reps - Wall Push Up  - 1 x daily - 7 x weekly - 2 sets - 10 reps - Standing 'L' Stretch at Counter  - 1 x daily - 7 x weekly - 2 reps - 20 sec hold - Shoulder Taps on Table  - 1 x daily - 7 x weekly - 2 sets - 10 reps  Patient Education - Ice Massage  ASSESSMENT:  CLINICAL  IMPRESSION: Khriz presents to skilled PT reporting that he is able to bear weight through his arm without the pain that he was having before, stating that he is feeling at least 50% better since starting PT.  Patient was able to progress with strengthening during session today to assist with improved postural awareness.  Patient with improved grip strength noted today.  Patient reports that he is sleeping better than he was at evaluation.  Patient reports that he will be following up with the MD soon, and despite having continued pain, he is feeling better.  Patient did report that pain decreased to 4/10 by end of session.  Patient continues to require skilled PT to progress towards goal related activities.    OBJECTIVE IMPAIRMENTS: decreased ROM, decreased strength, hypomobility, increased muscle spasms, impaired flexibility, impaired tone, impaired UE functional use, postural dysfunction, and pain.   ACTIVITY LIMITATIONS: carrying, lifting, sleeping, bathing, dressing, reach over head, and  hygiene/grooming  PARTICIPATION LIMITATIONS: meal prep, cleaning, laundry, interpersonal relationship, driving, shopping, community activity, and occupation  PERSONAL FACTORS: Fitness, Time since onset of injury/illness/exacerbation, and 1-2 comorbidities: HTN;DB are also affecting patient's functional outcome.   REHAB POTENTIAL: Good  CLINICAL DECISION MAKING: Evolving/moderate complexity  EVALUATION COMPLEXITY: Moderate  GOALS: Goals reviewed with patient? Yes  SHORT TERM GOALS: Target date: 01/25/2024  Patient will be independent with initial HEP. Baseline:  Goal status: Met on 01/15/24  2.  Patient will report > or = to 30% improvement in left elbow pain since starting PT. Baseline:  Goal status: Met on 01/29/2024  3.  Patient will report 25% improvement in sleep quality since starting therapy. Baseline:  Goal status: MET on 01/29/2024 (pt reports that his shoulder is not hurting him as much during sleep)   LONG TERM GOALS: Target date: 02/22/2024  Patient will demonstrate independence in advanced HEP. Baseline:  Goal status: Ongoing  2.  Patient will report > or = to 50% improvement in left elbow pain since starting PT. Baseline:  Goal status: Met on 01/29/24 reporting 50% improvement  3.  Patient will be able to perform bending and lifting activities with < or = to 2/10 elbow pain . Baseline:  Goal status: Ongoing  4.  Patient will demonstrate at least a 15lb improvement in left grip strength to help performance of ADLs and functional activities. Baseline: 35lb  Goal status: METon 01/29/24 with 70#  5.  Patient will score > or = to 60/100 on QuickDash due to improved function of UEs. Baseline: 50/100 Goal status: IN PROGRESS    PLAN: PT FREQUENCY: 1x/week  PT DURATION: 8 weeks  PLANNED INTERVENTIONS: 97164- PT Re-evaluation, 97110-Therapeutic exercises, 97530- Therapeutic activity, 97112- Neuromuscular re-education, 97535- Self Care, [id number]- Manual therapy,  [phone]- Canalith repositioning, [id number]- Aquatic Therapy, [phone]- Electrical stimulation (unattended), [phone]- Electrical stimulation (manual), [id number]- Ultrasound, [id number]- Traction (mechanical), [id number]- Ionotophoresis 4mg /ml Dexamethasone, 79439 (1-2 muscles), [id number] (3+ muscles)- Dry Needling, Patient/Family education, Balance training, Stair training, Taping, Joint mobilization, Joint manipulation, Spinal manipulation, Spinal mobilization, Vestibular training, Cryotherapy, and Moist heat  PLAN FOR NEXT SESSION: Review/update HEP as indicated, strengthening, postural/core strengthening     Jarrell Laming, PT, DPT 01/29/24, 8:50 AM  [facility] [address] [place], [place] [address] Phone # [phone] Fax [phone]

## 2024-02-06 ENCOUNTER — Telehealth: Payer: Self-pay | Admitting: Rehabilitative and Restorative Service Providers"

## 2024-02-06 ENCOUNTER — Ambulatory Visit: Payer: Self-pay | Attending: Family Medicine | Admitting: Rehabilitative and Restorative Service Providers"

## 2024-02-06 DIAGNOSIS — M25511 Pain in right shoulder: Secondary | ICD-10-CM | POA: Insufficient documentation

## 2024-02-06 DIAGNOSIS — M25522 Pain in left elbow: Secondary | ICD-10-CM | POA: Insufficient documentation

## 2024-02-06 DIAGNOSIS — M25512 Pain in left shoulder: Secondary | ICD-10-CM | POA: Insufficient documentation

## 2024-02-06 DIAGNOSIS — R293 Abnormal posture: Secondary | ICD-10-CM | POA: Insufficient documentation

## 2024-02-06 DIAGNOSIS — G8929 Other chronic pain: Secondary | ICD-10-CM | POA: Insufficient documentation

## 2024-02-06 DIAGNOSIS — M6281 Muscle weakness (generalized): Secondary | ICD-10-CM | POA: Insufficient documentation

## 2024-02-06 NOTE — Telephone Encounter (Signed)
 Called patient using [facility] interpreter regarding missed PT visit on 02/06/2024.  Patient apologized stating that he forgot about his appointment and was already at work for the day.  Patient confirmed his appointment for next week.

## 2024-02-12 ENCOUNTER — Ambulatory Visit: Payer: Self-pay | Admitting: Rehabilitative and Restorative Service Providers"

## 2024-02-19 ENCOUNTER — Encounter: Payer: Self-pay | Admitting: Rehabilitative and Restorative Service Providers"

## 2024-02-19 ENCOUNTER — Ambulatory Visit: Payer: Self-pay | Admitting: Rehabilitative and Restorative Service Providers"

## 2024-02-19 DIAGNOSIS — M25522 Pain in left elbow: Secondary | ICD-10-CM

## 2024-02-19 DIAGNOSIS — M6281 Muscle weakness (generalized): Secondary | ICD-10-CM

## 2024-02-19 DIAGNOSIS — R293 Abnormal posture: Secondary | ICD-10-CM

## 2024-02-19 DIAGNOSIS — G8929 Other chronic pain: Secondary | ICD-10-CM

## 2024-02-19 NOTE — Therapy (Signed)
 OUTPATIENT PHYSICAL THERAPY TREATMENT NOTE AND REASSESSMENT NOTE   Patient Name: Steve Mccarthy MRN: [id number] DOB:[birth year], 58 y.o., male Today's Date: 02/19/2024  END OF SESSION:  PT End of Session - 02/19/24 0804     Visit Number 6    Date for Recertification  03/22/24    Authorization Type Self Pay    PT Start Time 0802    PT Stop Time 0840    PT Time Calculation (min) 38 min    Activity Tolerance Patient tolerated treatment well    Behavior During Therapy WFL for tasks assessed/performed          Past Medical History:  Diagnosis Date   Diabetes mellitus without complication (HCC)    Hypertension    History reviewed. No pertinent surgical history. There are no active problems to display for this patient.   PCP: None  REFERRING PROVIDER: Hinojosa-Clapp, Marcela, MD  REFERRING DIAG: M75.80 (ICD-10-CM) - Other shoulder lesions, unspecified shoulder  THERAPY DIAG:  Pain in left elbow - Plan: PT plan of care cert/re-cert  Chronic pain of both shoulders - Plan: PT plan of care cert/re-cert  Muscle weakness (generalized) - Plan: PT plan of care cert/re-cert  Abnormal posture - Plan: PT plan of care cert/re-cert  Rationale for Evaluation and Treatment: Rehabilitation  ONSET DATE: a few months ago  SUBJECTIVE:                                                                                                                                                                                      SUBJECTIVE STATEMENT: Patient reports that his arm is feeling better, but still states pain of 6-7/10.   EVAL: Patient presents with left elbow pain that started a few months ago. He was working under his car and he had to pull hard and that is when his pain started. At rest he does not have pain but when he is doing quick movements or putting weight through the elbow it hurts. Denies numbness and tingling in fingers. Limiting functional activities: working,  lifting, putting weight through hands, showering/getting dressed. Patient also has bilateral shoulder pain at night. He tosses and turns at night. The shoulder pain is not all the time. Hand dominance: Right   PERTINENT HISTORY: HTN; DBM  PAIN:  02/19/2024 Are you having pain? Yes: NPRS scale: 6-7/10 Pain location: olecranon up into triceps Pain description: intermittent, sharp Aggravating factors: See above Relieving factors: Nothing  PRECAUTIONS: None  RED FLAGS: None   WEIGHT BEARING RESTRICTIONS: No  FALLS:  Has patient fallen in last 6 months? No  LIVING ENVIRONMENT: Lives with: lives with their family Lives in: House/apartment  OCCUPATION: Remodels  homes  PLOF: Independent, Independent with basic ADLs, Independent with household mobility without device, Independent with community mobility without device, Independent with gait, and Independent with transfers  PATIENT GOALS: To decrease pain  NEXT MD VISIT: PRN  OBJECTIVE:  Note: Objective measures were completed at Evaluation unless otherwise noted.  DIAGNOSTIC FINDINGS:   Left Elbow Radiograph on 12/18/23: IMPRESSION:  No acute osseous abnormalities.  Degenerative changes in the elbow as described.  Old healed fracture of the radial neck.   Right foot radiograph on 12/18/23: IMPRESSION:  No acute osseous abnormalities.  Inferior calcaneal spur. Heel edema.  Mild degenerative changes in the midfoot.  Small osteochondroma distal first metatarsal.   PATIENT SURVEYS :  Eval:  QuickDash:50/100 50%  02/19/2024:  Quick DASH 20.5/100 = 20.5%   COGNITION: Overall cognitive status: Within functional limits for tasks assessed     SENSATION: WFL  POSTURE: Rounded shoulders  UPPER EXTREMITY ROM: *pain  Active ROM Right eval Left eval Left 02/19/24  Shoulder flexion     Shoulder extension     Shoulder abduction     Shoulder adduction     Shoulder internal rotation     Shoulder external rotation      Elbow flexion     Elbow extension     Wrist flexion WFL WFL put painful * 65  Wrist extension WFL Lacking  5 degrees of extn* 61  Wrist ulnar deviation   35  Wrist radial deviation   20  Wrist pronation     Wrist supination     (Blank rows = not tested)  UPPER EXTREMITY MMT:  MMT Right eval Left eval  Shoulder flexion 4+ 4-*  Shoulder extension    Shoulder abduction 5 4+  Shoulder adduction    Shoulder internal rotation    Shoulder external rotation    Middle trapezius    Lower trapezius    Elbow flexion 5 4  Elbow extension 5 4-*  Wrist flexion    Wrist extension    Wrist ulnar deviation    Wrist radial deviation    Wrist pronation    Wrist supination  limited  Grip strength (lbs) 90 35*  (Blank rows = not tested)  01/29/2024: Grip strength:  left- 70#  02/19/2024: Grip strength:  left- 76#  JOINT MOBILITY TESTING:   Hard end feel of elbow extension and wrist supination  PALPATION:  Tenderness with palpation olecranon and triceps insertion Pain at end range elbow flexion and extension                                                                                                                             TREATMENT DATE:   02/19/2024: UBE level 1.0 x3 min each direction with PT present to discuss status and pain Quick DASH 20.45% Seated red flex bar for grip fwd/backwards x1 min Seated supination/pronation with 2# dumbbell 2x10 left UE Seated ulnar/radial deviation with 2# dumbbell 2x10 left UE Seated ulnar  nerve glide with hands in prayer stretch position moving side to side 2x10 Standing 3 way scapular stabilization/clocks with yellow loop 2x5 bilat Standing wall push ups 2x10 Standing rows 25# 2x10 Standing chops with 10# cable pulley x10 bilat   01/29/2024: UBE level 1.0 x3 min each direction with PT present to discuss status and pain Seated red flex bar for grip fwd/backwards x1 min Seated scapular retraction 2x10 Pt reports at least 50%  improvement since starting PT Seated supination/pronation with 1# dumbbell 2x10 left UE Seated ulnar/radial deviation with 1# dumbbell 2x10 left UE Seated ulnar nerve glide with hands in prayer stretch position moving side to side x10 Standing 4D scapular stabilization with 2# light blue plyoball x20 left UE Standing 3 way scapular stabilization/clocks with yellow loop 2x5 bilat Standing wall push ups 2x10 Standing performing alt shoulder taps from barre 2x10 Standing L counter stretch 2 x 20 sec bilat   01/22/2024: Seated isometric bicep contraction 2x10 (cuing for submaximal force) Seated ulnar nerve flossing (hand in snake movement) to left UE  x10 Seated ulnar nerve glide with hands in prayer stretch position moving side to side x10 Seated triceps isometric contraction to left UE 2x10 (cuing for submaximal force) Seated triceps stretch 2x20 sec left UE Seated red flex bar for grip fwd/backwards x1 min Seated scapular retraction 2x10 Seated supination/pronation with 1# dumbbell 2x10 left UE Seated ulnar/radial deviation with 1# dumbbell 2x10 left UE Manual Therapy:  soft tissue mobilization and manual trigger point release to left forearm and triceps, as well as bilateral upper trap region to promote decreased pain and improved tissue mobility.     PATIENT EDUCATION: Education details: PT eval findings, anticipated POC, progress with PT, and initial HEP Person educated: Patient Education method: Explanation, Demonstration, and Handouts Education comprehension: verbalized understanding, returned demonstration, and needs further education  HOME EXERCISE PROGRAM: Access Code: [email or web address] URL: [email or web address] Date: 01/29/2024 Prepared by: Jarrell Laming  Exercises - Standing Overhead Triceps Stretch  - 1 x daily - 7 x weekly - 2 sets - 20-30s hold - Seated Gripping Towel  - 1 x daily - 7 x weekly - 1-2 sets - 5 reps - 5 hold - Seated Scapular Retraction  - 1 x  daily - 7 x weekly - 1 sets - 10 reps - Standing Shoulder Row with Anchored Resistance  - 1 x daily - 7 x weekly - 2 sets - 10 reps - Seated Isometric Elbow Flexion  - 1 x daily - 7 x weekly - 1 sets - 10 reps - 10 hold - Seated Isometric Elbow Extension  - 1 x daily - 7 x weekly - 1 sets - 10 reps - 10 hold - Forearm Pronation and Supination Caregiver PROM  - 1 x daily - 7 x weekly - 1 sets - 10 reps - Ulnar Nerve Flossing  - 1 x daily - 7 x weekly - 2 sets - 10 reps - Ulnar Nerve Flossing  - 1 x daily - 7 x weekly - 2 sets - 10 reps - Wall Push Up  - 1 x daily - 7 x weekly - 2 sets - 10 reps - Standing 'L' Stretch at Counter  - 1 x daily - 7 x weekly - 2 reps - 20 sec hold - Shoulder Taps on Table  - 1 x daily - 7 x weekly - 2 sets - 10 reps  Patient Education - Ice Massage  ASSESSMENT:  CLINICAL IMPRESSION:  Steve Mccarthy presents to skilled PT  reporting that he is feeling some better, but is still having pain, especially with certain job tasks, such as using the machinery.  Patient continues to have muscle weakness and pain more localized to his left lateral epicondyle.  Patient with improved score on DASH.  Patient may benefit from use of iontophoresis, but still awaiting signed certification to allow for use.  Patient would benefit from continued skilled PT to progress towards goal related activities and continue to address decreased pain during work activities.    OBJECTIVE IMPAIRMENTS: decreased ROM, decreased strength, hypomobility, increased muscle spasms, impaired flexibility, impaired tone, impaired UE functional use, postural dysfunction, and pain.   ACTIVITY LIMITATIONS: carrying, lifting, sleeping, bathing, dressing, reach over head, and hygiene/grooming  PARTICIPATION LIMITATIONS: meal prep, cleaning, laundry, interpersonal relationship, driving, shopping, community activity, and occupation  PERSONAL FACTORS: Fitness, Time since onset of injury/illness/exacerbation, and 1-2  comorbidities: HTN;DB are also affecting patient's functional outcome.   REHAB POTENTIAL: Good  CLINICAL DECISION MAKING: Evolving/moderate complexity  EVALUATION COMPLEXITY: Moderate  GOALS: Goals reviewed with patient? Yes  SHORT TERM GOALS: Target date: 01/25/2024  Patient will be independent with initial HEP. Baseline:  Goal status: Met on 01/15/24  2.  Patient will report > or = to 30% improvement in left elbow pain since starting PT. Baseline:  Goal status: Met on 01/29/2024  3.  Patient will report 25% improvement in sleep quality since starting therapy. Baseline:  Goal status: MET on 01/29/2024 (pt reports that his shoulder is not hurting him as much during sleep)   LONG TERM GOALS: Target date: 03/22/2024  Patient will demonstrate independence in advanced HEP. Baseline:  Goal status: Ongoing  2.  Patient will report > or = to 50% improvement in left elbow pain since starting PT. Baseline:  Goal status: Met on 01/29/24 reporting 50% improvement  3.  Patient will be able to perform bending and lifting activities with < or = to 4/10 elbow pain . Baseline:  Goal status: Ongoing  4.  Patient will demonstrate at least a 15lb improvement in left grip strength to help performance of ADLs and functional activities. Baseline: 35lb  Goal status: METon 01/29/24 with 70#  5.  Patient will score no greater than 15% on QuickDash due to improved function of UEs. Baseline: 50/100 Goal status: IN PROGRESS    PLAN: PT FREQUENCY: 1x/week  PT DURATION: 8 weeks  PLANNED INTERVENTIONS: 97164- PT Re-evaluation, 97750- Physical Performance Testing, 97110-Therapeutic exercises, 97530- Therapeutic activity, 97112- Neuromuscular re-education, 97535- Self Care, [id number]- Manual therapy, [phone]- Canalith repositioning, [id number]- Aquatic Therapy, [phone]- Electrical stimulation (unattended), [phone]- Electrical stimulation (manual), [id number]- Ultrasound, [id number]- Traction (mechanical), [id number]-  Ionotophoresis 4mg /ml Dexamethasone, 79439 (1-2 muscles), [id number] (3+ muscles)- Dry Needling, Patient/Family education, Balance training, Stair training, Taping, Joint mobilization, Joint manipulation, Spinal manipulation, Spinal mobilization, Vestibular training, Cryotherapy, and Moist heat  PLAN FOR NEXT SESSION: Review/update HEP as indicated, strengthening, postural/core strengthening, iontopatch once MD signs off of certification     Jarrell Laming, PT, DPT 02/19/24, 9:53 AM  [facility] [address], [address] [place], [place] [address] Phone # [phone] Fax [phone]

## 2024-02-26 ENCOUNTER — Ambulatory Visit: Payer: Self-pay | Admitting: Rehabilitative and Restorative Service Providers"

## 2024-02-26 ENCOUNTER — Encounter: Payer: Self-pay | Admitting: Rehabilitative and Restorative Service Providers"

## 2024-02-26 DIAGNOSIS — M6281 Muscle weakness (generalized): Secondary | ICD-10-CM

## 2024-02-26 DIAGNOSIS — G8929 Other chronic pain: Secondary | ICD-10-CM

## 2024-02-26 DIAGNOSIS — R293 Abnormal posture: Secondary | ICD-10-CM

## 2024-02-26 DIAGNOSIS — M25522 Pain in left elbow: Secondary | ICD-10-CM

## 2024-02-26 NOTE — Patient Instructions (Signed)

## 2024-02-26 NOTE — Therapy (Signed)
 OUTPATIENT PHYSICAL THERAPY TREATMENT NOTE   Patient Name: Steve Mccarthy MRN: [id number] DOB:May 22, 1965, 58 y.o., male Today's Date: 02/26/2024  END OF SESSION:  PT End of Session - 02/26/24 0810     Visit Number 7    Date for Recertification  03/22/24    Authorization Type Self Pay    PT Start Time 0800    PT Stop Time 0840    PT Time Calculation (min) 40 min    Activity Tolerance Patient tolerated treatment well    Behavior During Therapy [facility] for tasks assessed/performed          Past Medical History:  Diagnosis Date   Diabetes mellitus without complication (HCC)    Hypertension    History reviewed. No pertinent surgical history. There are no active problems to display for this patient.   PCP: None  REFERRING PROVIDER: Hinojosa-Clapp, Marcela, MD  REFERRING DIAG: M75.80 (ICD-10-CM) - Other shoulder lesions, unspecified shoulder  THERAPY DIAG:  Pain in left elbow  Chronic pain of both shoulders  Muscle weakness (generalized)  Abnormal posture  Rationale for Evaluation and Treatment: Rehabilitation  ONSET DATE: a few months ago  SUBJECTIVE:                                                                                                                                                                                      SUBJECTIVE STATEMENT: Patient reports that his arm is feeling better today with less pain.  Reports that his shoulder is better, he is only having targeted pain on his left elbow.   EVAL: Patient presents with left elbow pain that started a few months ago. He was working under his car and he had to pull hard and that is when his pain started. At rest he does not have pain but when he is doing quick movements or putting weight through the elbow it hurts. Denies numbness and tingling in fingers. Limiting functional activities: working, lifting, putting weight through hands, showering/getting dressed. Patient also has bilateral  shoulder pain at night. He tosses and turns at night. The shoulder pain is not all the time. Hand dominance: Right   PERTINENT HISTORY: HTN; DBM  PAIN:  02/26/2024 Are you having pain? Yes: NPRS scale: 5-6/10 Pain location: olecranon up into triceps Pain description: intermittent, sharp Aggravating factors: See above Relieving factors: Nothing  PRECAUTIONS: None  RED FLAGS: None   WEIGHT BEARING RESTRICTIONS: No  FALLS:  Has patient fallen in last 6 months? No  LIVING ENVIRONMENT: Lives with: lives with their family Lives in: House/apartment  OCCUPATION: Remodels homes  PLOF: Independent, Independent with basic ADLs, Independent with household mobility without device, Independent with  community mobility without device, Independent with gait, and Independent with transfers  PATIENT GOALS: To decrease pain  NEXT MD VISIT: PRN  OBJECTIVE:  Note: Objective measures were completed at Evaluation unless otherwise noted.  DIAGNOSTIC FINDINGS:   Left Elbow Radiograph on 12/18/23: IMPRESSION:  No acute osseous abnormalities.  Degenerative changes in the elbow as described.  Old healed fracture of the radial neck.   Right foot radiograph on 12/18/23: IMPRESSION:  No acute osseous abnormalities.  Inferior calcaneal spur. Heel edema.  Mild degenerative changes in the midfoot.  Small osteochondroma distal first metatarsal.   PATIENT SURVEYS :  Eval:  QuickDash:50/100 50%  02/19/2024:  Quick DASH 20.5/100 = 20.5%   COGNITION: Overall cognitive status: Within functional limits for tasks assessed     SENSATION: WFL  POSTURE: Rounded shoulders  UPPER EXTREMITY ROM: *pain  Active ROM Right eval Left eval Left 02/19/24  Shoulder flexion     Shoulder extension     Shoulder abduction     Shoulder adduction     Shoulder internal rotation     Shoulder external rotation     Elbow flexion     Elbow extension     Wrist flexion WFL WFL put painful * 65  Wrist  extension WFL Lacking  5 degrees of extn* 61  Wrist ulnar deviation   35  Wrist radial deviation   20  Wrist pronation     Wrist supination     (Blank rows = not tested)  UPPER EXTREMITY MMT:  MMT Right eval Left eval  Shoulder flexion 4+ 4-*  Shoulder extension    Shoulder abduction 5 4+  Shoulder adduction    Shoulder internal rotation    Shoulder external rotation    Middle trapezius    Lower trapezius    Elbow flexion 5 4  Elbow extension 5 4-*  Wrist flexion    Wrist extension    Wrist ulnar deviation    Wrist radial deviation    Wrist pronation    Wrist supination  limited  Grip strength (lbs) 90 35*  (Blank rows = not tested)  01/29/2024: Grip strength:  left- 70#  02/19/2024: Grip strength:  left- 76#  JOINT MOBILITY TESTING:   Hard end feel of elbow extension and wrist supination  PALPATION:  Tenderness with palpation olecranon and triceps insertion Pain at end range elbow flexion and extension                                                                                                                             TREATMENT DATE:   02/26/2024: UBE level 1.5 x3 min each direction with PT present to discuss status and pain Standing 4D scapular stabilization with blue 2# plyoball x20 each Standing 3 way scapular stabilization/clocks with yellow loop 2x5 bilat Standing ulnar nerve glide with hands in prayer stretch position moving side to side x10 Standing rows 25# 2x10 Standing chops with 10# cable pulley 2x10 bilat  Seated red flex bar for grip fwd/backwards x1 min Seated supination/pronation with 3# dumbbell 2x10 left UE Seated ulnar/radial deviation with 3# dumbbell 2x10 left UE Seated triceps isometric contraction to left UE 2x10 (cuing for submaximal force) Iontopatch:  80 mA/min 4 hour patch with 4 mg/mL dexamethasone. Provided education about iontophoresis   02/19/2024: UBE level 1.0 x3 min each direction with PT present to discuss status  and pain Quick DASH 20.45% Seated red flex bar for grip fwd/backwards x1 min Seated supination/pronation with 2# dumbbell 2x10 left UE Seated ulnar/radial deviation with 2# dumbbell 2x10 left UE Seated ulnar nerve glide with hands in prayer stretch position moving side to side 2x10 Standing 3 way scapular stabilization/clocks with yellow loop 2x5 bilat Standing wall push ups 2x10 Standing rows 25# 2x10 Standing chops with 10# cable pulley x10 bilat Standing shoulder flexion with 3# shoulder flexion 2x10 left UE Standing shoulder abduction with 3# shoulder flexion 2x10 left UE   01/29/2024: UBE level 1.0 x3 min each direction with PT present to discuss status and pain Seated red flex bar for grip fwd/backwards x1 min Seated scapular retraction 2x10 Pt reports at least 50% improvement since starting PT Seated supination/pronation with 1# dumbbell 2x10 left UE Seated ulnar/radial deviation with 1# dumbbell 2x10 left UE Seated ulnar nerve glide with hands in prayer stretch position moving side to side x10 Standing 4D scapular stabilization with 2# light blue plyoball x20 left UE Standing 3 way scapular stabilization/clocks with yellow loop 2x5 bilat Standing wall push ups 2x10 Standing performing alt shoulder taps from barre 2x10 Standing L counter stretch 2 x 20 sec bilat    PATIENT EDUCATION: Education details: PT eval findings, anticipated POC, progress with PT, and initial HEP Person educated: Patient Education method: Explanation, Demonstration, and Handouts Education comprehension: verbalized understanding, returned demonstration, and needs further education  HOME EXERCISE PROGRAM: Access Code: [email or web address] URL: [email or web address] Date: 01/29/2024 Prepared by: Jarrell Laming  Exercises - Standing Overhead Triceps Stretch  - 1 x daily - 7 x weekly - 2 sets - 20-30s hold - Seated Gripping Towel  - 1 x daily - 7 x weekly - 1-2 sets - 5 reps - 5 hold - Seated  Scapular Retraction  - 1 x daily - 7 x weekly - 1 sets - 10 reps - Standing Shoulder Row with Anchored Resistance  - 1 x daily - 7 x weekly - 2 sets - 10 reps - Seated Isometric Elbow Flexion  - 1 x daily - 7 x weekly - 1 sets - 10 reps - 10 hold - Seated Isometric Elbow Extension  - 1 x daily - 7 x weekly - 1 sets - 10 reps - 10 hold - Forearm Pronation and Supination Caregiver PROM  - 1 x daily - 7 x weekly - 1 sets - 10 reps - Ulnar Nerve Flossing  - 1 x daily - 7 x weekly - 2 sets - 10 reps - Ulnar Nerve Flossing  - 1 x daily - 7 x weekly - 2 sets - 10 reps - Wall Push Up  - 1 x daily - 7 x weekly - 2 sets - 10 reps - Standing 'L' Stretch at Counter  - 1 x daily - 7 x weekly - 2 reps - 20 sec hold - Shoulder Taps on Table  - 1 x daily - 7 x weekly - 2 sets - 10 reps  Patient Education - Ice Massage  ASSESSMENT:  CLINICAL IMPRESSION:  [place] presents to skilled  PT reporting continued gradual improvement.  MD has returned initial certification signed, which included ability to perform iontophoresis.  Patient educated about iontophoresis and he was agreeable to trying modality.  Patient continues to progress with overall strengthening, with only pain in his left elbow (no longer his entire arm/shoulder).  Patient educated about 4 hour wear time for iontophoresis and to remove patch if he has any adverse effects, he verbalizes understanding.  Patient reported that he could feel that it did seem to be helping some by end of session.  Patient continues to require skilled PT to progress towards goal related activities.    OBJECTIVE IMPAIRMENTS: decreased ROM, decreased strength, hypomobility, increased muscle spasms, impaired flexibility, impaired tone, impaired UE functional use, postural dysfunction, and pain.   ACTIVITY LIMITATIONS: carrying, lifting, sleeping, bathing, dressing, reach over head, and hygiene/grooming  PARTICIPATION LIMITATIONS: meal prep, cleaning, laundry, interpersonal  relationship, driving, shopping, community activity, and occupation  PERSONAL FACTORS: Fitness, Time since onset of injury/illness/exacerbation, and 1-2 comorbidities: HTN;DB are also affecting patient's functional outcome.   REHAB POTENTIAL: Good  CLINICAL DECISION MAKING: Evolving/moderate complexity  EVALUATION COMPLEXITY: Moderate  GOALS: Goals reviewed with patient? Yes  SHORT TERM GOALS: Target date: 01/25/2024  Patient will be independent with initial HEP. Baseline:  Goal status: Met on 01/15/24  2.  Patient will report > or = to 30% improvement in left elbow pain since starting PT. Baseline:  Goal status: Met on 01/29/2024  3.  Patient will report 25% improvement in sleep quality since starting therapy. Baseline:  Goal status: MET on 01/29/2024 (pt reports that his shoulder is not hurting him as much during sleep)   LONG TERM GOALS: Target date: 03/22/2024  Patient will demonstrate independence in advanced HEP. Baseline:  Goal status: Ongoing  2.  Patient will report > or = to 50% improvement in left elbow pain since starting PT. Baseline:  Goal status: Met on 01/29/24 reporting 50% improvement  3.  Patient will be able to perform bending and lifting activities with < or = to 4/10 elbow pain . Baseline:  Goal status: Ongoing  4.  Patient will demonstrate at least a 15lb improvement in left grip strength to help performance of ADLs and functional activities. Baseline: 35lb  Goal status: METon 01/29/24 with 70#  5.  Patient will score no greater than 15% on QuickDash due to improved function of UEs. Baseline: 50/100 Goal status: IN PROGRESS (20.5% on 02/19/24)    PLAN: PT FREQUENCY: 1x/week  PT DURATION: 8 weeks  PLANNED INTERVENTIONS: 97164- PT Re-evaluation, 97750- Physical Performance Testing, 97110-Therapeutic exercises, 97530- Therapeutic activity, 97112- Neuromuscular re-education, 97535- Self Care, 02859- Manual therapy, [phone]- Canalith repositioning,  [id number]- Aquatic Therapy, G0283- Electrical stimulation (unattended), [phone]- Electrical stimulation (manual), [id number]- Ultrasound, [id number]- Traction (mechanical), [id number]- Ionotophoresis 4mg /ml Dexamethasone, 79439 (1-2 muscles), [id number] (3+ muscles)- Dry Needling, Patient/Family education, Balance training, Stair training, Taping, Joint mobilization, Joint manipulation, Spinal manipulation, Spinal mobilization, Vestibular training, Cryotherapy, and Moist heat  PLAN FOR NEXT SESSION: Review/update HEP as indicated, strengthening, postural/core strengthening, assess response to iontopatch     Jarrell Laming, PT, DPT 02/26/24, 9:07 AM  [facility] [address] [place], [place] [address] Phone # [phone] Fax [phone]

## 2024-03-04 ENCOUNTER — Encounter: Payer: Self-pay | Admitting: Physical Therapy

## 2024-03-04 ENCOUNTER — Ambulatory Visit: Payer: Self-pay | Attending: Family Medicine | Admitting: Physical Therapy

## 2024-03-04 DIAGNOSIS — M25511 Pain in right shoulder: Secondary | ICD-10-CM | POA: Insufficient documentation

## 2024-03-04 DIAGNOSIS — R293 Abnormal posture: Secondary | ICD-10-CM | POA: Insufficient documentation

## 2024-03-04 DIAGNOSIS — G8929 Other chronic pain: Secondary | ICD-10-CM | POA: Insufficient documentation

## 2024-03-04 DIAGNOSIS — M25512 Pain in left shoulder: Secondary | ICD-10-CM | POA: Insufficient documentation

## 2024-03-04 DIAGNOSIS — M6281 Muscle weakness (generalized): Secondary | ICD-10-CM | POA: Insufficient documentation

## 2024-03-04 DIAGNOSIS — M25522 Pain in left elbow: Secondary | ICD-10-CM | POA: Insufficient documentation

## 2024-03-04 NOTE — Therapy (Signed)
 OUTPATIENT PHYSICAL THERAPY TREATMENT NOTE   Patient Name: Steve Mccarthy MRN: [id number] DOB:05/28/1965, 58 y.o., male Today's Date: 03/04/2024  END OF SESSION:  PT End of Session - 03/04/24 0850     Visit Number 8    Date for Recertification  03/22/24    Authorization Type Self Pay    PT Start Time 0756    PT Stop Time 0842    PT Time Calculation (min) 46 min    Activity Tolerance Patient tolerated treatment well    Behavior During Therapy [facility] for tasks assessed/performed           Past Medical History:  Diagnosis Date   Diabetes mellitus without complication (HCC)    Hypertension    History reviewed. No pertinent surgical history. There are no active problems to display for this patient.   PCP: None  REFERRING PROVIDER: Hinojosa-Clapp, Marcela, MD  REFERRING DIAG: M75.80 (ICD-10-CM) - Other shoulder lesions, unspecified shoulder  THERAPY DIAG:  Pain in left elbow  Chronic pain of both shoulders  Muscle weakness (generalized)  Abnormal posture  Rationale for Evaluation and Treatment: Rehabilitation  ONSET DATE: a few months ago  SUBJECTIVE:                                                                                                                                                                                      SUBJECTIVE STATEMENT: Patient reports he noticed some improvements with the ionto patch. He does not have any pain at rest, but with movement pain is 8/10.   EVAL: Patient presents with left elbow pain that started a few months ago. He was working under his car and he had to pull hard and that is when his pain started. At rest he does not have pain but when he is doing quick movements or putting weight through the elbow it hurts. Denies numbness and tingling in fingers. Limiting functional activities: working, lifting, putting weight through hands, showering/getting dressed. Patient also has bilateral shoulder pain at night. He  tosses and turns at night. The shoulder pain is not all the time. Hand dominance: Right   PERTINENT HISTORY: HTN; DBM  PAIN:  02/26/2024 Are you having pain? Yes: NPRS scale: 5-6/10 Pain location: olecranon up into triceps Pain description: intermittent, sharp Aggravating factors: See above Relieving factors: Nothing  PRECAUTIONS: None  RED FLAGS: None   WEIGHT BEARING RESTRICTIONS: No  FALLS:  Has patient fallen in last 6 months? No  LIVING ENVIRONMENT: Lives with: lives with their family Lives in: House/apartment  OCCUPATION: Remodels homes  PLOF: Independent, Independent with basic ADLs, Independent with household mobility without device, Independent with community mobility without device,  Independent with gait, and Independent with transfers  PATIENT GOALS: To decrease pain  NEXT MD VISIT: PRN  OBJECTIVE:  Note: Objective measures were completed at Evaluation unless otherwise noted.  DIAGNOSTIC FINDINGS:   Left Elbow Radiograph on 12/18/23: IMPRESSION:  No acute osseous abnormalities.  Degenerative changes in the elbow as described.  Old healed fracture of the radial neck.   Right foot radiograph on 12/18/23: IMPRESSION:  No acute osseous abnormalities.  Inferior calcaneal spur. Heel edema.  Mild degenerative changes in the midfoot.  Small osteochondroma distal first metatarsal.   PATIENT SURVEYS :  Eval:  QuickDash:50/100 50%  02/19/2024:  Quick DASH 20.5/100 = 20.5%   COGNITION: Overall cognitive status: Within functional limits for tasks assessed     SENSATION: WFL  POSTURE: Rounded shoulders  UPPER EXTREMITY ROM: *pain  Active ROM Right eval Left eval Left 02/19/24  Shoulder flexion     Shoulder extension     Shoulder abduction     Shoulder adduction     Shoulder internal rotation     Shoulder external rotation     Elbow flexion     Elbow extension     Wrist flexion WFL WFL put painful * 65  Wrist extension WFL Lacking  5  degrees of extn* 61  Wrist ulnar deviation   35  Wrist radial deviation   20  Wrist pronation     Wrist supination     (Blank rows = not tested)  UPPER EXTREMITY MMT:  MMT Right eval Left eval  Shoulder flexion 4+ 4-*  Shoulder extension    Shoulder abduction 5 4+  Shoulder adduction    Shoulder internal rotation    Shoulder external rotation    Middle trapezius    Lower trapezius    Elbow flexion 5 4  Elbow extension 5 4-*  Wrist flexion    Wrist extension    Wrist ulnar deviation    Wrist radial deviation    Wrist pronation    Wrist supination  limited  Grip strength (lbs) 90 35*  (Blank rows = not tested)  01/29/2024: Grip strength:  left- 70#  02/19/2024: Grip strength:  left- 76#  JOINT MOBILITY TESTING:   Hard end feel of elbow extension and wrist supination  PALPATION:  Tenderness with palpation olecranon and triceps insertion Pain at end range elbow flexion and extension                                                                                                                             TREATMENT DATE:  03/04/2024: UBE level 1.5 x3 min each direction with PT present to discuss status and pain Manual: Left elbow AP joint mobs and AP radial head glides- Grade II-III Standing 4D scapular stabilization with blue 2# plyoball x20 each Beige thera flexbar U and twist x 10 each (painful in both elbows) Standing rows 25# 2x10 (pain with full Lt elbow extension) Standing chops with 10# cable pulley  2x10 bilat -single hand  Standing wrist flexion & extension stretch 2 x 20 sec bilateral  Seated supination/pronation with 3# dumbbell 2x10 left UE Seated ulnar/radial deviation with 3# dumbbell 2x10 left UE Standing upright row with 15# KB 2 x 10 Holding 15# KB + wrist prontation supination x 10 each direction (pain on Lt with supination) Iontopatch:  80 mA/min 4 hour patch with 4 mg/mL dexamethasone.     02/26/2024: UBE level 1.5 x3 min each direction  with PT present to discuss status and pain Standing 4D scapular stabilization with blue 2# plyoball x20 each Standing 3 way scapular stabilization/clocks with yellow loop 2x5 bilat Standing ulnar nerve glide with hands in prayer stretch position moving side to side x10 Standing rows 25# 2x10 Standing chops with 10# cable pulley 2x10 bilat Seated red flex bar for grip fwd/backwards x1 min Seated supination/pronation with 3# dumbbell 2x10 left UE Seated ulnar/radial deviation with 3# dumbbell 2x10 left UE Seated triceps isometric contraction to left UE 2x10 (cuing for submaximal force) Iontopatch:  80 mA/min 4 hour patch with 4 mg/mL dexamethasone. Provided education about iontophoresis   02/19/2024: UBE level 1.0 x3 min each direction with PT present to discuss status and pain Quick DASH 20.45% Seated red flex bar for grip fwd/backwards x1 min Seated supination/pronation with 2# dumbbell 2x10 left UE Seated ulnar/radial deviation with 2# dumbbell 2x10 left UE Seated ulnar nerve glide with hands in prayer stretch position moving side to side 2x10 Standing 3 way scapular stabilization/clocks with yellow loop 2x5 bilat Standing wall push ups 2x10 Standing rows 25# 2x10 Standing chops with 10# cable pulley x10 bilat Standing shoulder flexion with 3# shoulder flexion 2x10 left UE Standing shoulder abduction with 3# shoulder flexion 2x10 left UE    PATIENT EDUCATION: Education details: PT eval findings, anticipated POC, progress with PT, and initial HEP Person educated: Patient Education method: Explanation, Demonstration, and Handouts Education comprehension: verbalized understanding, returned demonstration, and needs further education  HOME EXERCISE PROGRAM: Access Code: [email or web address] URL: [email or web address] Date: 01/29/2024 Prepared by: Jarrell Laming  Exercises - Standing Overhead Triceps Stretch  - 1 x daily - 7 x weekly - 2 sets - 20-30s hold - Seated Gripping  Towel  - 1 x daily - 7 x weekly - 1-2 sets - 5 reps - 5 hold - Seated Scapular Retraction  - 1 x daily - 7 x weekly - 1 sets - 10 reps - Standing Shoulder Row with Anchored Resistance  - 1 x daily - 7 x weekly - 2 sets - 10 reps - Seated Isometric Elbow Flexion  - 1 x daily - 7 x weekly - 1 sets - 10 reps - 10 hold - Seated Isometric Elbow Extension  - 1 x daily - 7 x weekly - 1 sets - 10 reps - 10 hold - Forearm Pronation and Supination Caregiver PROM  - 1 x daily - 7 x weekly - 1 sets - 10 reps - Ulnar Nerve Flossing  - 1 x daily - 7 x weekly - 2 sets - 10 reps - Ulnar Nerve Flossing  - 1 x daily - 7 x weekly - 2 sets - 10 reps - Wall Push Up  - 1 x daily - 7 x weekly - 2 sets - 10 reps - Standing 'L' Stretch at Counter  - 1 x daily - 7 x weekly - 2 reps - 20 sec hold - Shoulder Taps on Table  - 1 x daily - 7 x weekly -  2 sets - 10 reps  Patient Education - Ice Massage  ASSESSMENT:  CLINICAL IMPRESSION: Doni presents with 0/10 pain at rest and 8/10 pain with movement. He noted some improvements with ionto patch after last session and patient was willing to try again today. Incorporated joint mobilization for elbow extension and improved radial head glide. With full extension noted firm end feel and it was painful for patient. He verbalized pain with motions requiring full left elbow extension 5/10 pain. With some modifications he was able to perform exercises. Educated patient on wear time of ionto patch. Patient will benefit from skilled PT to address the below impairments and improve overall function.     OBJECTIVE IMPAIRMENTS: decreased ROM, decreased strength, hypomobility, increased muscle spasms, impaired flexibility, impaired tone, impaired UE functional use, postural dysfunction, and pain.   ACTIVITY LIMITATIONS: carrying, lifting, sleeping, bathing, dressing, reach over head, and hygiene/grooming  PARTICIPATION LIMITATIONS: meal prep, cleaning, laundry, interpersonal  relationship, driving, shopping, community activity, and occupation  PERSONAL FACTORS: Fitness, Time since onset of injury/illness/exacerbation, and 1-2 comorbidities: HTN;DB are also affecting patient's functional outcome.   REHAB POTENTIAL: Good  CLINICAL DECISION MAKING: Evolving/moderate complexity  EVALUATION COMPLEXITY: Moderate  GOALS: Goals reviewed with patient? Yes  SHORT TERM GOALS: Target date: 01/25/2024  Patient will be independent with initial HEP. Baseline:  Goal status: Met on 01/15/24  2.  Patient will report > or = to 30% improvement in left elbow pain since starting PT. Baseline:  Goal status: Met on 01/29/2024  3.  Patient will report 25% improvement in sleep quality since starting therapy. Baseline:  Goal status: MET on 01/29/2024 (pt reports that his shoulder is not hurting him as much during sleep)   LONG TERM GOALS: Target date: 03/22/2024  Patient will demonstrate independence in advanced HEP. Baseline:  Goal status: Ongoing  2.  Patient will report > or = to 50% improvement in left elbow pain since starting PT. Baseline:  Goal status: Met on 01/29/24 reporting 50% improvement  3.  Patient will be able to perform bending and lifting activities with < or = to 4/10 elbow pain . Baseline:  Goal status: Ongoing  4.  Patient will demonstrate at least a 15lb improvement in left grip strength to help performance of ADLs and functional activities. Baseline: 35lb  Goal status: METon 01/29/24 with 70#  5.  Patient will score no greater than 15% on QuickDash due to improved function of UEs. Baseline: 50/100 Goal status: IN PROGRESS (20.5% on 02/19/24)    PLAN: PT FREQUENCY: 1x/week  PT DURATION: 8 weeks  PLANNED INTERVENTIONS: 97164- PT Re-evaluation, 97750- Physical Performance Testing, 97110-Therapeutic exercises, 97530- Therapeutic activity, 97112- Neuromuscular re-education, 97535- Self Care, 02859- Manual therapy, [phone]- Canalith repositioning,  [id number]- Aquatic Therapy, G0283- Electrical stimulation (unattended), [phone]- Electrical stimulation (manual), [id number]- Ultrasound, [id number]- Traction (mechanical), [id number]- Ionotophoresis 4mg /ml Dexamethasone, 79439 (1-2 muscles), 20561 (3+ muscles)- Dry Needling, Patient/Family education, Balance training, Stair training, Taping, Joint mobilization, Joint manipulation, Spinal manipulation, Spinal mobilization, Vestibular training, Cryotherapy, and Moist heat  PLAN FOR NEXT SESSION: strengthening, postural/core strengthening, assess response to iontopatch      Kristeen Sar, PT, DPT 03/04/24 8:51 AM [facility] [address], [address] [place], [place] [address] Phone # [phone] Fax [phone]

## 2024-03-11 ENCOUNTER — Telehealth: Payer: Self-pay | Admitting: Physical Therapy

## 2024-03-11 ENCOUNTER — Ambulatory Visit: Payer: Self-pay | Admitting: Physical Therapy

## 2024-03-11 NOTE — Telephone Encounter (Signed)
 Left patient a message about missed appointment with the help of an interpreter. Offered patient openings we have today and reminded him of next scheduled appointment.  Kristeen Sar, PT, DPT 03/11/24 8:22 AM

## 2024-03-18 ENCOUNTER — Telehealth: Payer: Self-pay | Admitting: Physical Therapy

## 2024-03-18 ENCOUNTER — Ambulatory Visit: Payer: Self-pay | Admitting: Physical Therapy

## 2024-03-18 NOTE — Telephone Encounter (Signed)
 Interpreter left a voicemail on patient's answering machine. Informed him to give the office a call if he wanted to return to therapy or discharge. This was patient's last scheduled appointment.   Kristeen Sar, PT, DPT 03/18/24 8:27 AM
